# Patient Record
Sex: Male | Born: 1975 | Race: Black or African American | Hispanic: No | Marital: Married | Smoking: Current every day smoker
Health system: Southern US, Community
[De-identification: ages and names within clinical notes are randomized; demographics above are authoritative.]

## PROBLEM LIST (undated history)

## (undated) DIAGNOSIS — K219 Gastro-esophageal reflux disease without esophagitis: Secondary | ICD-10-CM

## (undated) DIAGNOSIS — E119 Type 2 diabetes mellitus without complications: Secondary | ICD-10-CM

## (undated) DIAGNOSIS — J45909 Unspecified asthma, uncomplicated: Secondary | ICD-10-CM

---

## 2018-02-05 ENCOUNTER — Emergency Department (HOSPITAL_COMMUNITY): Payer: Self-pay

## 2018-02-05 ENCOUNTER — Emergency Department (HOSPITAL_COMMUNITY)
Admission: EM | Admit: 2018-02-05 | Discharge: 2018-02-05 | Disposition: A | Discharge status: Home / Self Care | Payer: Self-pay | Attending: Emergency Medicine | Admitting: Emergency Medicine

## 2018-02-05 ENCOUNTER — Encounter (HOSPITAL_COMMUNITY): Payer: Self-pay | Admitting: Emergency Medicine

## 2018-02-05 DIAGNOSIS — Y92410 Unspecified street and highway as the place of occurrence of the external cause: Secondary | ICD-10-CM | POA: Insufficient documentation

## 2018-02-05 DIAGNOSIS — Y998 Other external cause status: Secondary | ICD-10-CM | POA: Insufficient documentation

## 2018-02-05 DIAGNOSIS — M62830 Muscle spasm of back: Secondary | ICD-10-CM | POA: Insufficient documentation

## 2018-02-05 DIAGNOSIS — Y939 Activity, unspecified: Secondary | ICD-10-CM | POA: Insufficient documentation

## 2018-02-05 DIAGNOSIS — S20219A Contusion of unspecified front wall of thorax, initial encounter: Secondary | ICD-10-CM | POA: Insufficient documentation

## 2018-02-05 DIAGNOSIS — M25512 Pain in left shoulder: Secondary | ICD-10-CM | POA: Insufficient documentation

## 2018-02-05 DIAGNOSIS — R0789 Other chest pain: Secondary | ICD-10-CM | POA: Insufficient documentation

## 2018-02-05 LAB — I-STAT CHEM 8, ED
BUN: 10 mg/dL (ref 6–20)
Calcium, Ion: 1.14 mmol/L — ABNORMAL LOW (ref 1.15–1.40)
Chloride: 101 mmol/L (ref 101–111)
Creatinine, Ser: 0.8 mg/dL (ref 0.61–1.24)
Glucose, Bld: 100 mg/dL — ABNORMAL HIGH (ref 65–99)
HEMATOCRIT: 44 % (ref 39.0–52.0)
HEMOGLOBIN: 15 g/dL (ref 13.0–17.0)
Potassium: 3.8 mmol/L (ref 3.5–5.1)
SODIUM: 139 mmol/L (ref 135–145)
TCO2: 25 mmol/L (ref 22–32)

## 2018-02-05 MED ORDER — HYDROCODONE-ACETAMINOPHEN 5-325 MG PO TABS
1.0000 | ORAL_TABLET | Freq: Once | ORAL | Status: AC
  Administered 2018-02-05: 1 via ORAL
  Filled 2018-02-05: qty 1

## 2018-02-05 MED ORDER — CYCLOBENZAPRINE HCL 10 MG PO TABS
10.0000 mg | ORAL_TABLET | Freq: Once | ORAL | Status: AC
  Administered 2018-02-05: 10 mg via ORAL
  Filled 2018-02-05: qty 1

## 2018-02-05 MED ORDER — DICLOFENAC SODIUM 50 MG PO TBEC: 50.0000 mg | DELAYED_RELEASE_TABLET | Freq: Two times a day (BID) | ORAL | 0 refills | Status: DC

## 2018-02-05 MED ORDER — CYCLOBENZAPRINE HCL 10 MG PO TABS: 10.0000 mg | ORAL_TABLET | Freq: Two times a day (BID) | ORAL | 0 refills | Status: DC | PRN

## 2018-02-05 MED ORDER — NAPROXEN 500 MG PO TABS: 500.0000 mg | ORAL_TABLET | Freq: Two times a day (BID) | ORAL | 0 refills | Status: DC

## 2018-02-05 NOTE — Progress Notes (Signed)
ED Nursing Note: Pt undressed to undergarment for exam by ED provider. Also instructed pt not to eat or drink until further notice. Pt understood request and agreed

## 2018-02-05 NOTE — ED Notes (Signed)
Bed: [facility] Expected date:  Expected time:  Means of arrival:  Comments: 

## 2018-02-05 NOTE — ED Notes (Signed)
PT DISCHARGED. INSTRUCTIONS AND PRESCRIPTIONS GIVEN. AAOX4. PT IN NO APPARENT DISTRESS WITH MODERATE PAIN. THE OPPORTUNITY TO ASK QUESTIONS WAS PROVIDED. 
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
## 2018-02-05 NOTE — Discharge Instructions (Addendum)
You may take tylenol in addition to the medications we give you. Follow up with your doctor or return here for worsening symptoms.

## 2018-11-15 ENCOUNTER — Ambulatory Visit (HOSPITAL_COMMUNITY)
Admission: EM | Admit: 2018-11-15 | Discharge: 2018-11-15 | Disposition: A | Discharge status: Home / Self Care | Payer: BLUE CROSS/BLUE SHIELD | Attending: Family Medicine | Admitting: Family Medicine

## 2018-11-15 ENCOUNTER — Encounter (HOSPITAL_COMMUNITY): Payer: Self-pay

## 2018-11-15 DIAGNOSIS — B372 Candidiasis of skin and nail: Secondary | ICD-10-CM | POA: Diagnosis not present

## 2018-11-15 DIAGNOSIS — M25561 Pain in right knee: Secondary | ICD-10-CM

## 2018-11-15 MED ORDER — CLOTRIMAZOLE 1 % EX CREA: TOPICAL_CREAM | CUTANEOUS | 0 refills | Status: DC

## 2018-11-15 MED ORDER — FLUCONAZOLE 150 MG PO TABS: 150.0000 mg | ORAL_TABLET | ORAL | 0 refills | Status: AC

## 2018-11-15 MED ORDER — NAPROXEN 500 MG PO TABS: 500.0000 mg | ORAL_TABLET | Freq: Two times a day (BID) | ORAL | 0 refills | Status: DC

## 2018-11-15 NOTE — Discharge Instructions (Signed)
Knee Pain: Use anti-inflammatories for pain/swelling. You may take up to 800 mg Ibuprofen every 8 hours OR Naprosyn twice daily with food. You may supplement Ibuprofen with Tylenol [phone] mg every 8 hours.  Ice and elevate knee at home to help with swelling and inflammation Wear knee brace for support Follow up with orthopedics if symptoms persisting in 2-3 weeks  Rash: This appears like yeast/jock itch Please use clotrimazole cream twice daily for 2 weeks May also try diflucan weekly for 4 weeks  Follow up if symptoms not improving or worsening

## 2018-11-15 NOTE — ED Triage Notes (Signed)
Pt presents with right knee pain from unknown source and rash in right groin area .

## 2018-11-15 NOTE — ED Provider Notes (Signed)
[facility]    CSN: [id number] Arrival date & time: 11/15/18  1530     History   Chief Complaint Chief Complaint  Patient presents with  . Knee Pain  . Rash    HPI James Adams is a 42 y.o. male no significant past medical history presenting today for evaluation of right knee pain and rash.  Patient states that he has had a rash to his groin off-and-on over the past few years.  Notices it worsens with sweating.  He is previously been told that it was yeast and prescribed powder.  States that the powder did not help.  Rashes associated with itching.  He also has tried applying cortisone cream and alcohol without relief of symptoms.  Denies any associated penile discharge, dysuria or increased frequency.  Denies abdominal pain.  Patient also complaining of right knee pain.  This is been going on for approximately 1 week.  He has noticed off-and-on swelling.  Worse at the end of the day after standing on his feet for most of the day.  He has not tried any over-the-counter medicines.  Is tried some elevation is noticed some worsening of stiffness after not moving the knee for extended period of time.  He denies any injury, trauma.  Denies history of arthritis, but does note that his dad has arthritis in his knees.  Often will feel a popping and clicking sensation or if something in his knees catching on something.  HPI  History reviewed. No pertinent past medical history.  There are no active problems to display for this patient.   History reviewed. No pertinent surgical history.     Home Medications    Prior to Admission medications   Medication Sig Start Date End Date Taking? Authorizing Provider  clotrimazole (LOTRIMIN) 1 % cream Apply to affected area 2 times daily 11/15/18   Wieters, Hallie C, PA-C  cyclobenzaprine (FLEXERIL) 10 MG tablet Take 1 tablet (10 mg total) by mouth 2 (two) times daily as needed for muscle spasms. 02/05/18   Janne Napoleon, NP  diclofenac  (VOLTAREN) 50 MG EC tablet Take 1 tablet (50 mg total) by mouth 2 (two) times daily. 02/05/18   Janne Napoleon, NP  fluconazole (DIFLUCAN) 150 MG tablet Take 1 tablet (150 mg total) by mouth once a week for 4 doses. 11/15/18 12/07/18  Wieters, Hallie C, PA-C  naproxen (NAPROSYN) 500 MG tablet Take 1 tablet (500 mg total) by mouth 2 (two) times daily. 11/15/18   Wieters, Junius Creamer, PA-C    Family History History reviewed. No pertinent family history.  Social History Social History   Tobacco Use  . Smoking status: Never Smoker  . Smokeless tobacco: Never Used  Substance Use Topics  . Alcohol use: Not on file  . Drug use: Not on file     Allergies   Patient has no known allergies.   Review of Systems Review of Systems  Constitutional: Negative for fatigue and fever.  Eyes: Negative for redness, itching and visual disturbance.  Respiratory: Negative for shortness of breath.   Cardiovascular: Negative for chest pain and leg swelling.  Gastrointestinal: Negative for nausea and vomiting.  Genitourinary: Negative for dysuria and genital sores.  Musculoskeletal: Positive for arthralgias, joint swelling and myalgias.  Skin: Positive for color change and rash. Negative for wound.  Neurological: Negative for dizziness, syncope, weakness, light-headedness and headaches.     Physical Exam Triage Vital Signs ED Triage Vitals  Enc Vitals Group  BP 11/15/18 1630 115/87     Pulse Rate 11/15/18 1630 95     Resp 11/15/18 1630 20     Temp 11/15/18 1630 98.2 F (36.8 C)     Temp Source 11/15/18 1630 Oral     SpO2 11/15/18 1630 95 %     Weight --      Height --      Head Circumference --      Peak Flow --      Pain Score 11/15/18 1631 6     Pain Loc --      Pain Edu? --      Excl. in GC? --    No data found.  Updated Vital Signs BP 115/87 (BP Location: Right Arm)   Pulse 95   Temp 98.2 F (36.8 C) (Oral)   Resp 20   SpO2 95%   Visual Acuity Right Eye Distance:   Left  Eye Distance:   Bilateral Distance:    Right Eye Near:   Left Eye Near:    Bilateral Near:     Physical Exam  Constitutional: He is oriented to person, place, and time. He appears well-developed and well-nourished.  No acute distress  HENT:  Head: Normocephalic and atraumatic.  Nose: Nose normal.  Eyes: Conjunctivae are normal.  Neck: Neck supple.  Cardiovascular: Normal rate.  Pulmonary/Chest: Effort normal. No respiratory distress.  Abdominal: He exhibits no distension.  Genitourinary:  Genitourinary Comments: Bilateral groin areas with hyperpigmentation, evidence of excoriation, no palpable nodes, induration or fluctuance  Musculoskeletal: Normal range of motion.  Right knee with mild swelling, no obvious deformity, full active range of motion of knee, audible pop underlying kneecap with extension, no crepitus palpated, no varus or valgus stress, negative Lachman's, negative McMurray's.  Neurological: He is alert and oriented to person, place, and time.  Skin: Skin is warm and dry.  Psychiatric: He has a normal mood and affect.  Nursing note and vitals reviewed.    UC Treatments / Results  Labs (all labs ordered are listed, but only abnormal results are displayed) Labs Reviewed - No data to display  EKG None  Radiology No results found.  Procedures Procedures (including critical care time)  Medications Ordered in UC Medications - No data to display  Initial Impression / Assessment and Plan / UC Course  I have reviewed the triage vital signs and the nursing notes.  Pertinent labs & imaging results that were available during my care of the patient were reviewed by me and considered in my medical decision making (see chart for details).     Patient appears to have yeast/dark itch to groin, as alternative to likely nystatin powder will try clotrimazole twice daily, given persistence of symptoms will also try Diflucan weekly for 2 to 4 weeks.  Discussed keeping area  dry.  Continue to monitor symptoms.  Right knee pain without injury, flares up and down with weightbearing.  Possible underlying arthritis.  Deferred imaging given likely will not change treatment.  Will provide knee brace for support, recommending anti-inflammatories, ice and elevation.  Follow-up if symptoms not resolving.Discussed strict return precautions. Patient verbalized understanding and is agreeable with plan.  Final Clinical Impressions(s) / UC Diagnoses   Final diagnoses:  Acute pain of right knee  Yeast dermatitis     Discharge Instructions     Knee Pain: Use anti-inflammatories for pain/swelling. You may take up to 800 mg Ibuprofen every 8 hours OR Naprosyn twice daily with food. You may supplement  Ibuprofen with Tylenol [phone] mg every 8 hours.  Ice and elevate knee at home to help with swelling and inflammation Wear knee brace for support Follow up with orthopedics if symptoms persisting in 2-3 weeks  Rash: This appears like yeast/jock itch Please use clotrimazole cream twice daily for 2 weeks May also try diflucan weekly for 4 weeks  Follow up if symptoms not improving or worsening   ED Prescriptions    Medication Sig Dispense Auth. Provider   clotrimazole (LOTRIMIN) 1 % cream Apply to affected area 2 times daily 24 g Wieters, Hallie C, PA-C   fluconazole (DIFLUCAN) 150 MG tablet Take 1 tablet (150 mg total) by mouth once a week for 4 doses. 4 tablet Wieters, Hallie C, PA-C   naproxen (NAPROSYN) 500 MG tablet Take 1 tablet (500 mg total) by mouth 2 (two) times daily. 30 tablet Wieters, SectionHallie C, PA-C     Controlled Substance Prescriptions [place] Controlled Substance Registry consulted? Not Applicable   Lew DawesWieters, Hallie C, [place]PA-C 11/15/18 2123

## 2019-03-08 ENCOUNTER — Other Ambulatory Visit: Payer: Self-pay

## 2019-03-08 ENCOUNTER — Encounter (HOSPITAL_COMMUNITY): Payer: Self-pay

## 2019-03-08 ENCOUNTER — Ambulatory Visit (HOSPITAL_COMMUNITY)
Admission: EM | Admit: 2019-03-08 | Discharge: 2019-03-08 | Disposition: A | Discharge status: Home / Self Care | Payer: BLUE CROSS/BLUE SHIELD | Attending: Family Medicine | Admitting: Family Medicine

## 2019-03-08 DIAGNOSIS — B9789 Other viral agents as the cause of diseases classified elsewhere: Secondary | ICD-10-CM

## 2019-03-08 DIAGNOSIS — J069 Acute upper respiratory infection, unspecified: Secondary | ICD-10-CM

## 2019-03-08 MED ORDER — AMOXICILLIN 500 MG PO CAPS: 500.0000 mg | ORAL_CAPSULE | Freq: Three times a day (TID) | ORAL | 0 refills | Status: DC

## 2019-03-08 NOTE — ED Provider Notes (Signed)
[facility]    CSN: [id number] Arrival date & time: 03/08/19  1040     History   Chief Complaint Chief Complaint  Patient presents with  . Cough  . Facial Pain    HPI James Adams is a 43 y.o. male.   Patient requesting note to return to work.  Had some cough over the weekend cough is somewhat productive and he feels this is related to some postnasal drainage.  He endorses some pain and pressure in his sinuses.  HPI  History reviewed. No pertinent past medical history.  There are no active problems to display for this patient.   History reviewed. No pertinent surgical history.     Home Medications    Prior to Admission medications   Medication Sig Start Date End Date Taking? Authorizing Provider  amoxicillin (AMOXIL) 500 MG capsule Take 1 capsule (500 mg total) by mouth 3 (three) times daily. 03/08/19   Frederica Kuster, MD  clotrimazole (LOTRIMIN) 1 % cream Apply to affected area 2 times daily 11/15/18   Wieters, Hallie C, PA-C  cyclobenzaprine (FLEXERIL) 10 MG tablet Take 1 tablet (10 mg total) by mouth 2 (two) times daily as needed for muscle spasms. 02/05/18   Janne Napoleon, NP  diclofenac (VOLTAREN) 50 MG EC tablet Take 1 tablet (50 mg total) by mouth 2 (two) times daily. 02/05/18   Janne Napoleon, NP  naproxen (NAPROSYN) 500 MG tablet Take 1 tablet (500 mg total) by mouth 2 (two) times daily. 11/15/18   Wieters, Junius Creamer, PA-C    Family History History reviewed. No pertinent family history.  Social History Social History   Tobacco Use  . Smoking status: Never Smoker  . Smokeless tobacco: Never Used  Substance Use Topics  . Alcohol use: Not on file  . Drug use: Not on file     Allergies   Patient has no known allergies.   Review of Systems Review of Systems  HENT: Positive for sinus pressure and sinus pain.   Respiratory: Positive for cough.   All other systems reviewed and are negative.    Physical Exam Triage Vital Signs ED Triage  Vitals  Enc Vitals Group     BP 03/08/19 1124 (!) 146/94     Pulse Rate 03/08/19 1124 89     Resp 03/08/19 1124 18     Temp 03/08/19 1124 98.3 F (36.8 C)     Temp Source 03/08/19 1124 Oral     SpO2 03/08/19 1124 98 %     Weight 03/08/19 1123 298 lb (135.2 kg)     Height --      Head Circumference --      Peak Flow --      Pain Score 03/08/19 1122 1     Pain Loc --      Pain Edu? --      Excl. in GC? --    No data found.  Updated Vital Signs BP (!) 146/94 (BP Location: Left Arm)   Pulse 89   Temp 98.3 F (36.8 C) (Oral)   Resp 18   Wt 135.2 kg   SpO2 98%   BMI 40.42 kg/m   Visual Acuity Right Eye Distance:   Left Eye Distance:   Bilateral Distance:    Right Eye Near:   Left Eye Near:    Bilateral Near:     Physical Exam Constitutional:      Appearance: Normal appearance. He is normal weight.  HENT:  Head:     Comments: There is tenderness in the maxillary sinuses    Right Ear: Tympanic membrane normal.     Left Ear: Tympanic membrane normal.     Mouth/Throat:     Pharynx: Oropharynx is clear.  Neck:     Musculoskeletal: Normal range of motion and neck supple.  Cardiovascular:     Rate and Rhythm: Normal rate and regular rhythm.  Pulmonary:     Effort: Pulmonary effort is normal.     Breath sounds: Normal breath sounds.  Neurological:     Mental Status: He is alert.      UC Treatments / Results  Labs (all labs ordered are listed, but only abnormal results are displayed) Labs Reviewed - No data to display  EKG None  Radiology No results found.  Procedures Procedures (including critical care time)  Medications Ordered in UC Medications - No data to display  Initial Impression / Assessment and Plan / UC Course  I have reviewed the triage vital signs and the nursing notes.  Pertinent labs & imaging results that were available during my care of the patient were reviewed by me and considered in my medical decision making (see chart for  details).     Sinusitis.  Will treat with amoxicillin continue guaifenesin Final Clinical Impressions(s) / UC Diagnoses   Final diagnoses:  Viral URI with cough   Discharge Instructions   None    ED Prescriptions    Medication Sig Dispense Auth. Provider   amoxicillin (AMOXIL) 500 MG capsule Take 1 capsule (500 mg total) by mouth 3 (three) times daily. 21 capsule Frederica Kuster, MD     Controlled Substance Prescriptions [place] Controlled Substance Registry consulted? No   Frederica Kuster, MD 03/08/19 [phone]

## 2019-03-08 NOTE — ED Triage Notes (Addendum)
Pt cc needs work note to return back to work . Because he was coughing over the weekend. Pt is having some sinus pressure x 4 days.

## 2019-03-10 ENCOUNTER — Telehealth (HOSPITAL_COMMUNITY): Payer: Self-pay | Admitting: Emergency Medicine

## 2019-03-10 MED ORDER — AMOXICILLIN 500 MG PO CAPS: 500.0000 mg | ORAL_CAPSULE | Freq: Three times a day (TID) | ORAL | 0 refills | Status: DC

## 2019-03-10 NOTE — Telephone Encounter (Signed)
Patient called, states he went to pick up his medications and they were not available.  After discussing discharge instructions, this RN noted patient's pharmacy to be incorrect.  Amoxicillin prescription sent to appropriate pharmacy for patient to pick up.,

## 2019-04-12 ENCOUNTER — Encounter (HOSPITAL_COMMUNITY): Payer: Self-pay

## 2019-04-12 ENCOUNTER — Other Ambulatory Visit: Payer: Self-pay

## 2019-04-12 ENCOUNTER — Ambulatory Visit (HOSPITAL_COMMUNITY)
Admission: EM | Admit: 2019-04-12 | Discharge: 2019-04-12 | Disposition: A | Discharge status: Home / Self Care | Payer: Managed Care, Other (non HMO) | Attending: Family Medicine | Admitting: Family Medicine

## 2019-04-12 DIAGNOSIS — J209 Acute bronchitis, unspecified: Secondary | ICD-10-CM | POA: Diagnosis not present

## 2019-04-12 MED ORDER — FLUTICASONE PROPIONATE 50 MCG/ACT NA SUSP: 1.0000 | Freq: Every day | NASAL | 0 refills | Status: DC

## 2019-04-12 MED ORDER — ALBUTEROL SULFATE HFA 108 (90 BASE) MCG/ACT IN AERS
1.0000 | INHALATION_SPRAY | Freq: Four times a day (QID) | RESPIRATORY_TRACT | 0 refills | Status: DC | PRN
Start: 2019-04-12 — End: 2020-01-10

## 2019-04-12 MED ORDER — BENZONATATE 200 MG PO CAPS
200.0000 mg | ORAL_CAPSULE | Freq: Three times a day (TID) | ORAL | 0 refills | Status: AC | PRN
Start: 2019-04-12 — End: 2019-04-19

## 2019-04-12 MED ORDER — DOXYCYCLINE HYCLATE 100 MG PO CAPS: 100.0000 mg | ORAL_CAPSULE | Freq: Two times a day (BID) | ORAL | 0 refills | Status: AC

## 2019-04-12 MED ORDER — CETIRIZINE HCL 10 MG PO CAPS: 10.0000 mg | ORAL_CAPSULE | Freq: Every day | ORAL | 0 refills | Status: DC

## 2019-04-12 NOTE — ED Notes (Signed)
Patient verbalizes understanding of discharge instructions. Opportunity for questioning and answers were provided. Patient discharged from [facility] by RN.  

## 2019-04-12 NOTE — ED Provider Notes (Signed)
[facility]    CSN: [id number] Arrival date & time: 04/12/19  4098     History   Chief Complaint Chief Complaint  Patient presents with  . Cough    HPI James Adams is a 43 y.o. male no contributing past medical history presenting today for evaluation of a cough.  Patient states that he has had a cough for little over a week.  He is also had associated nasal congestion.  Denies sore throat.  Denies fevers, chills or body aches.  He is also noted increased shortness of breath and chest tightness recently.  He notes this all started after his wife brought home with cat which is recently been removed from the home.  He notes his symptoms were mainly worse at nighttime and was noting wheezing at nighttime.  Has a history of bronchitis, former smoker.  Has been using albuterol inhaler which he is now out of.  HPI  History reviewed. No pertinent past medical history.  There are no active problems to display for this patient.   History reviewed. No pertinent surgical history.     Home Medications    Prior to Admission medications   Medication Sig Start Date End Date Taking? Authorizing Provider  albuterol (VENTOLIN HFA) 108 (90 Base) MCG/ACT inhaler Inhale 1-2 puffs into the lungs every 6 (six) hours as needed for wheezing or shortness of breath. 04/12/19   ,  C, PA-C  benzonatate (TESSALON) 200 MG capsule Take 1 capsule (200 mg total) by mouth 3 (three) times daily as needed for up to 7 days for cough. 04/12/19 04/19/19  ,  C, PA-C  Cetirizine HCl 10 MG CAPS Take 1 capsule (10 mg total) by mouth daily. 04/12/19   ,  C, PA-C  clotrimazole (LOTRIMIN) 1 % cream Apply to affected area 2 times daily 11/15/18   ,  C, PA-C  cyclobenzaprine (FLEXERIL) 10 MG tablet Take 1 tablet (10 mg total) by mouth 2 (two) times daily as needed for muscle spasms. 02/05/18   Janne Napoleon, NP  diclofenac (VOLTAREN) 50 MG EC tablet Take 1 tablet (50 mg  total) by mouth 2 (two) times daily. 02/05/18   Janne Napoleon, NP  doxycycline (VIBRAMYCIN) 100 MG capsule Take 1 capsule (100 mg total) by mouth 2 (two) times daily for 10 days. 04/12/19 04/22/19  ,  C, PA-C  fluticasone (FLONASE) 50 MCG/ACT nasal spray Place 1-2 sprays into both nostrils daily for 7 days. 04/12/19 04/19/19  ,  C, PA-C  naproxen (NAPROSYN) 500 MG tablet Take 1 tablet (500 mg total) by mouth 2 (two) times daily. 11/15/18   , Junius Creamer, PA-C    Family History History reviewed. No pertinent family history.  Social History Social History   Tobacco Use  . Smoking status: Never Smoker  . Smokeless tobacco: Never Used  Substance Use Topics  . Alcohol use: Not on file  . Drug use: Not on file     Allergies   Patient has no known allergies.   Review of Systems Review of Systems  Constitutional: Negative for activity change, appetite change, chills, fatigue and fever.  HENT: Positive for congestion and rhinorrhea. Negative for ear pain, sinus pressure, sore throat and trouble swallowing.   Eyes: Negative for discharge and redness.  Respiratory: Positive for cough, chest tightness, shortness of breath and wheezing.   Cardiovascular: Negative for chest pain.  Gastrointestinal: Negative for abdominal pain, diarrhea, nausea and vomiting.  Musculoskeletal: Negative for myalgias.  Skin:  Negative for rash.  Neurological: Negative for dizziness, light-headedness and headaches.     Physical Exam Triage Vital Signs ED Triage Vitals  Enc Vitals Group     BP 04/12/19 0919 (!) 139/105     Pulse Rate 04/12/19 0919 91     Resp 04/12/19 0919 17     Temp 04/12/19 0919 98.2 F (36.8 C)     Temp Source 04/12/19 0919 Oral     SpO2 04/12/19 0919 99 %     Weight --      Height --      Head Circumference --      Peak Flow --      Pain Score 04/12/19 0917 0     Pain Loc --      Pain Edu? --      Excl. in GC? --    No data found.  Updated Vital  Signs BP (!) 139/105 (BP Location: Left Arm)   Pulse 91   Temp 98.2 F (36.8 C) (Oral)   Resp 17   SpO2 99%   Visual Acuity Right Eye Distance:   Left Eye Distance:   Bilateral Distance:    Right Eye Near:   Left Eye Near:    Bilateral Near:     Physical Exam Vitals signs and nursing note reviewed.  Constitutional:      Appearance: He is well-developed.  HENT:     Head: Normocephalic and atraumatic.     Ears:     Comments: Bilateral TMs difficult to visualize due to cerumen-denied ear pain    Nose:     Comments: Nasal mucosa non-erythematous, mildly swollen turbinates    Mouth/Throat:     Comments: Oral mucosa pink and moist, no tonsillar enlargement or exudate. Posterior pharynx patent and erythematous, no uvula deviation or swelling. Normal phonation. Eyes:     Conjunctiva/sclera: Conjunctivae normal.  Neck:     Musculoskeletal: Neck supple.  Cardiovascular:     Rate and Rhythm: Normal rate and regular rhythm.     Heart sounds: No murmur.  Pulmonary:     Effort: Pulmonary effort is normal. No respiratory distress.     Breath sounds: Normal breath sounds.     Comments: Breathing comfortably at rest, CTABL, no wheezing, rales or other adventitious sounds auscultated Abdominal:     Palpations: Abdomen is soft.     Tenderness: There is no abdominal tenderness.  Skin:    General: Skin is warm and dry.  Neurological:     Mental Status: He is alert.      UC Treatments / Results  Labs (all labs ordered are listed, but only abnormal results are displayed) Labs Reviewed - No data to display  EKG None  Radiology No results found.  Procedures Procedures (including critical care time)  Medications Ordered in UC Medications - No data to display  Initial Impression / Assessment and Plan / UC Course  I have reviewed the triage vital signs and the nursing notes.  Pertinent labs & imaging results that were available during my care of the patient were reviewed by  me and considered in my medical decision making (see chart for details).     Vital signs stable, no fever, tachycardia or hypoxia.  Given reported wheezing with shortness of breath will treat for bronchitis, lungs clear during visit.  Do not suspect underlying pneumonia at this time.  Discussed with patient cannot rule out coronavirus, the feel this is less likely given lacking associated systemic symptoms.  Deferring chest x-ray for now based off vitals and auscultation.  Will cover for atypicals with doxycycline, Tessalon for cough.  Refilling albuterol inhaler.  Deferring steroids at this time.  Also recommended starting allergy medicine of Zyrtec and Flonase with any allergies contributing to symptoms given recent cat exposure.  Continue to monitor,Discussed strict return precautions. Patient verbalized understanding and is agreeable with plan.  Final Clinical Impressions(s) / UC Diagnoses   Final diagnoses:  Acute bronchitis, unspecified organism     Discharge Instructions     Please begin taking doxycycline for the next 10 days, to cover for infection in the lungs Tessalon every 8 hours as needed for cough Continue albuterol inhaler as needed for shortness of breath, wheezing Begin daily cetirizine/Zyrtec to help with any congestion/drainage/allergies Flonase nasal spray 1 to 2 sprays in each nostril daily Rest, drink plenty of fluids  Please stay at home as much as possible and limit exposure to others until symptoms fully resolved  Please follow-up if symptoms worsening, developing increased shortness of breath or difficulty breathing   ED Prescriptions    Medication Sig Dispense Auth. Provider   doxycycline (VIBRAMYCIN) 100 MG capsule Take 1 capsule (100 mg total) by mouth 2 (two) times daily for 10 days. 20 capsule ,  C, PA-C   benzonatate (TESSALON) 200 MG capsule Take 1 capsule (200 mg total) by mouth 3 (three) times daily as needed for up to 7 days for cough.  28 capsule ,  C, PA-C   albuterol (VENTOLIN HFA) 108 (90 Base) MCG/ACT inhaler Inhale 1-2 puffs into the lungs every 6 (six) hours as needed for wheezing or shortness of breath. 1 Inhaler ,  C, PA-C   Cetirizine HCl 10 MG CAPS Take 1 capsule (10 mg total) by mouth daily. 15 capsule ,  C, PA-C   fluticasone (FLONASE) 50 MCG/ACT nasal spray Place 1-2 sprays into both nostrils daily for 7 days. 1 g , Bruno C, PA-C     Controlled Substance Prescriptions [place] Controlled Substance Registry consulted? Not Applicable   Lew Dawes, [place] 04/12/19 [phone]

## 2019-04-12 NOTE — ED Triage Notes (Signed)
Patient presents to Urgent Care with complaints of cough that wont go away and need for inhaler refill since about a week ago. Patient states he has a cat allergy and his wife recently came home with a cat, which she has since gotten rid of. Pt has productive cough and runny nose.

## 2019-04-12 NOTE — Discharge Instructions (Signed)
Please begin taking doxycycline for the next 10 days, to cover for infection in the lungs Tessalon every 8 hours as needed for cough Continue albuterol inhaler as needed for shortness of breath, wheezing Begin daily cetirizine/Zyrtec to help with any congestion/drainage/allergies Flonase nasal spray 1 to 2 sprays in each nostril daily Rest, drink plenty of fluids  Please stay at home as much as possible and limit exposure to others until symptoms fully resolved  Please follow-up if symptoms worsening, developing increased shortness of breath or difficulty breathing

## 2019-04-27 ENCOUNTER — Emergency Department (HOSPITAL_COMMUNITY)
Admission: EM | Admit: 2019-04-27 | Discharge: 2019-04-27 | Disposition: A | Discharge status: Home / Self Care | Payer: Managed Care, Other (non HMO) | Attending: Emergency Medicine | Admitting: Emergency Medicine

## 2019-04-27 ENCOUNTER — Emergency Department (HOSPITAL_COMMUNITY): Payer: Managed Care, Other (non HMO)

## 2019-04-27 ENCOUNTER — Encounter (HOSPITAL_COMMUNITY): Payer: Self-pay | Admitting: Emergency Medicine

## 2019-04-27 ENCOUNTER — Other Ambulatory Visit: Payer: Self-pay

## 2019-04-27 DIAGNOSIS — R05 Cough: Secondary | ICD-10-CM | POA: Insufficient documentation

## 2019-04-27 DIAGNOSIS — R109 Unspecified abdominal pain: Secondary | ICD-10-CM

## 2019-04-27 DIAGNOSIS — Y929 Unspecified place or not applicable: Secondary | ICD-10-CM | POA: Diagnosis not present

## 2019-04-27 DIAGNOSIS — R1011 Right upper quadrant pain: Secondary | ICD-10-CM | POA: Diagnosis present

## 2019-04-27 DIAGNOSIS — T148XXA Other injury of unspecified body region, initial encounter: Secondary | ICD-10-CM

## 2019-04-27 DIAGNOSIS — Y999 Unspecified external cause status: Secondary | ICD-10-CM | POA: Diagnosis not present

## 2019-04-27 DIAGNOSIS — X58XXXA Exposure to other specified factors, initial encounter: Secondary | ICD-10-CM | POA: Insufficient documentation

## 2019-04-27 DIAGNOSIS — S29019A Strain of muscle and tendon of unspecified wall of thorax, initial encounter: Secondary | ICD-10-CM | POA: Insufficient documentation

## 2019-04-27 DIAGNOSIS — R079 Chest pain, unspecified: Secondary | ICD-10-CM | POA: Diagnosis not present

## 2019-04-27 DIAGNOSIS — Y939 Activity, unspecified: Secondary | ICD-10-CM | POA: Insufficient documentation

## 2019-04-27 MED ORDER — CYCLOBENZAPRINE HCL 10 MG PO TABS: 10.0000 mg | ORAL_TABLET | Freq: Three times a day (TID) | ORAL | 0 refills | Status: DC

## 2019-04-27 MED ORDER — TRAMADOL HCL 50 MG PO TABS: 50.0000 mg | ORAL_TABLET | Freq: Four times a day (QID) | ORAL | 0 refills | Status: DC | PRN

## 2019-04-27 MED ORDER — DICLOFENAC SODIUM 75 MG PO TBEC: 75.0000 mg | DELAYED_RELEASE_TABLET | Freq: Two times a day (BID) | ORAL | 0 refills | Status: DC

## 2019-04-27 NOTE — Discharge Instructions (Addendum)
Your oxygen level is within normal limits.  Your chest x-ray is negative for collapsed lung, fluid, mass, or pneumonia.  There is no evidence of a rib fracture or dislocation noted on your x-ray.  Your examination favors acute muscle strain.  Heating pad to this area during rest will be helpful.  Please use diclofenac 2 times daily with food.  Use Flexeril 3 times daily for spasm pain.  May use Ultram for more severe pain.  Ultram and Flexeril may cause drowsiness.  Please do not drive a vehicle, operate machinery, drink alcohol, or participate in activities requiring concentration when taking this medication.

## 2019-04-27 NOTE — ED Provider Notes (Signed)
[facility] Provider Note   CSN: [id number] Arrival date & time: 04/27/19  1746    History   Chief Complaint Chief Complaint  Patient presents with  . Flank Pain    HPI James Adams is a 43 y.o. male.     HPI  History reviewed. No pertinent past medical history.  There are no active problems to display for this patient.   History reviewed. No pertinent surgical history.      Home Medications    Prior to Admission medications   Medication Sig Start Date End Date Taking? Authorizing Provider  albuterol (VENTOLIN HFA) 108 (90 Base) MCG/ACT inhaler Inhale 1-2 puffs into the lungs every 6 (six) hours as needed for wheezing or shortness of breath. 04/12/19   Wieters, Hallie C, PA-C  Cetirizine HCl 10 MG CAPS Take 1 capsule (10 mg total) by mouth daily. 04/12/19   Wieters, Hallie C, PA-C  clotrimazole (LOTRIMIN) 1 % cream Apply to affected area 2 times daily 11/15/18   Wieters, Hallie C, PA-C  cyclobenzaprine (FLEXERIL) 10 MG tablet Take 1 tablet (10 mg total) by mouth 2 (two) times daily as needed for muscle spasms. 02/05/18   Janne NapoleonNeese, Hope M, NP  diclofenac (VOLTAREN) 50 MG EC tablet Take 1 tablet (50 mg total) by mouth 2 (two) times daily. 02/05/18   Janne NapoleonNeese, Hope M, NP  fluticasone (FLONASE) 50 MCG/ACT nasal spray Place 1-2 sprays into both nostrils daily for 7 days. 04/12/19 04/19/19  Wieters, Hallie C, PA-C  naproxen (NAPROSYN) 500 MG tablet Take 1 tablet (500 mg total) by mouth 2 (two) times daily. 11/15/18   Wieters, Junius CreamerHallie C, PA-C    Family History No family history on file.  Social History Social History   Tobacco Use  . Smoking status: Never Smoker  . Smokeless tobacco: Never Used  Substance Use Topics  . Alcohol use: Not on file  . Drug use: Not on file     Allergies   Patient has no known allergies.   Review of Systems Review of Systems  Constitutional: Negative for activity change, chills, diaphoresis and fatigue.       All ROS Neg  except as noted in HPI  HENT: Positive for sneezing.   Eyes: Negative for photophobia and discharge.  Respiratory: Negative for cough, shortness of breath and wheezing.   Cardiovascular: Negative for chest pain and palpitations.  Gastrointestinal: Negative for abdominal pain and blood in stool.  Genitourinary: Negative for dysuria, frequency and hematuria.  Musculoskeletal: Negative for arthralgias, back pain and neck pain.       Rib pain  Skin: Negative.   Neurological: Negative for dizziness, seizures and speech difficulty.  Psychiatric/Behavioral: Negative for confusion and hallucinations.     Physical Exam Updated Vital Signs BP 129/90 (BP Location: Right Arm)   Pulse 89   Temp 98.6 F (37 C) (Oral)   Resp 12   Ht 6' (1.829 m)   Wt 136.1 kg   SpO2 94%   BMI 40.69 kg/m   Physical Exam Vitals signs and nursing note reviewed.  Constitutional:      Appearance: He is well-developed. He is not toxic-appearing.  HENT:     Head: Normocephalic.     Right Ear: Tympanic membrane and external ear normal.     Left Ear: Tympanic membrane and external ear normal.  Eyes:     General: Lids are normal.     Pupils: Pupils are equal, round, and reactive to light.  Neck:  Musculoskeletal: Normal range of motion and neck supple.     Vascular: No carotid bruit.  Cardiovascular:     Rate and Rhythm: Normal rate and regular rhythm.     Pulses: Normal pulses.     Heart sounds: Normal heart sounds.  Pulmonary:     Effort: No respiratory distress.     Breath sounds: Normal breath sounds.     Comments: Patient speaks in complete sentences.  There is symmetrical rise and fall of the chest.  No use of accessory muscles. Right lower chest pain reproduced with ROM. No deformity. No crepitus. Chest:       Comments: Mild to mod xyphoid tenderness. No deformity or crepitus. Abdominal:     General: Bowel sounds are normal.     Palpations: Abdomen is soft.     Tenderness: There is no  abdominal tenderness. There is no guarding.  Musculoskeletal: Normal range of motion.  Lymphadenopathy:     Head:     Right side of head: No submandibular adenopathy.     Left side of head: No submandibular adenopathy.     Cervical: No cervical adenopathy.  Skin:    General: Skin is warm and dry.  Neurological:     Mental Status: He is alert and oriented to person, place, and time.     Cranial Nerves: No cranial nerve deficit.     Sensory: No sensory deficit.  Psychiatric:        Speech: Speech normal.      ED Treatments / Results  Labs (all labs ordered are listed, but only abnormal results are displayed) Labs Reviewed - No data to display  EKG None  Radiology No results found.  Procedures Procedures (including critical care time)  Medications Ordered in ED Medications - No data to display   Initial Impression / Assessment and Plan / ED Course  I have reviewed the triage vital signs and the nursing notes.  Pertinent labs & imaging results that were available during my care of the patient were reviewed by me and considered in my medical decision making (see chart for details).          Final Clinical Impressions(s) / ED Diagnoses MDM  Vital signs are WNL  Pt speaks in complete sentences. Symmetrical rise and fall of the chest. Chest xray neg for bone abnormality. Lungs clear. Exam favors muscle strain.  Rx for muscle relaxant given. Pt to return if any problem or changes in condition.   Final diagnoses:  Muscle strain  Right flank pain    ED Discharge Orders         Ordered    cyclobenzaprine (FLEXERIL) 10 MG tablet  3 times daily     04/27/19 1932    traMADol (ULTRAM) 50 MG tablet  Every 6 hours PRN     04/27/19 1932    diclofenac (VOLTAREN) 75 MG EC tablet  2 times daily     04/27/19 1932           Ivery Quale, PA-C 04/28/19 1712    Terrilee Files, MD 05/01/19 [phone]

## 2019-04-27 NOTE — ED Triage Notes (Signed)
Pt states that he sneezed and is having pain in right abd and flank since then

## 2019-04-27 NOTE — ED Notes (Signed)
Pt c/o sudden pain to right upper anterior/lateral ribs after sneezing. C/o knot at end of sternum that has been there for a while  And wants it checked out. No obvious swelling noted. Nad.

## 2020-01-10 ENCOUNTER — Emergency Department (HOSPITAL_COMMUNITY)
Admission: EM | Admit: 2020-01-10 | Discharge: 2020-01-10 | Disposition: A | Discharge status: Home / Self Care | Payer: Self-pay | Attending: Emergency Medicine | Admitting: Emergency Medicine

## 2020-01-10 ENCOUNTER — Emergency Department (HOSPITAL_COMMUNITY): Payer: Self-pay

## 2020-01-10 ENCOUNTER — Encounter (HOSPITAL_COMMUNITY): Payer: Self-pay | Admitting: Emergency Medicine

## 2020-01-10 ENCOUNTER — Other Ambulatory Visit: Payer: Self-pay

## 2020-01-10 DIAGNOSIS — E669 Obesity, unspecified: Secondary | ICD-10-CM | POA: Insufficient documentation

## 2020-01-10 DIAGNOSIS — Z79899 Other long term (current) drug therapy: Secondary | ICD-10-CM | POA: Insufficient documentation

## 2020-01-10 DIAGNOSIS — Z20822 Contact with and (suspected) exposure to covid-19: Secondary | ICD-10-CM | POA: Insufficient documentation

## 2020-01-10 DIAGNOSIS — R062 Wheezing: Secondary | ICD-10-CM | POA: Insufficient documentation

## 2020-01-10 DIAGNOSIS — Z87891 Personal history of nicotine dependence: Secondary | ICD-10-CM | POA: Insufficient documentation

## 2020-01-10 DIAGNOSIS — Z6841 Body Mass Index (BMI) 40.0 and over, adult: Secondary | ICD-10-CM | POA: Insufficient documentation

## 2020-01-10 MED ORDER — ALBUTEROL SULFATE HFA 108 (90 BASE) MCG/ACT IN AERS
1.0000 | INHALATION_SPRAY | Freq: Four times a day (QID) | RESPIRATORY_TRACT | 1 refills | Status: DC | PRN
Start: 2020-01-10 — End: 2022-04-01

## 2020-01-10 MED ORDER — FLUTICASONE PROPIONATE 50 MCG/ACT NA SUSP: 2.0000 | Freq: Every day | NASAL | 2 refills | Status: DC

## 2020-01-10 MED ORDER — PREDNISONE 10 MG (21) PO TBPK: ORAL_TABLET | ORAL | 0 refills | Status: DC

## 2020-01-10 MED ORDER — MONTELUKAST SODIUM 10 MG PO TABS: 10.0000 mg | ORAL_TABLET | Freq: Every day | ORAL | 2 refills | Status: DC

## 2020-01-10 MED ORDER — PREDNISONE 20 MG PO TABS
60.0000 mg | ORAL_TABLET | Freq: Once | ORAL | Status: AC
  Administered 2020-01-10: 12:00:00 60 mg via ORAL
  Filled 2020-01-10: qty 3

## 2020-01-10 MED ORDER — ALBUTEROL SULFATE HFA 108 (90 BASE) MCG/ACT IN AERS
2.0000 | INHALATION_SPRAY | Freq: Once | RESPIRATORY_TRACT | Status: AC
  Administered 2020-01-10: 12:00:00 2 via RESPIRATORY_TRACT
  Filled 2020-01-10: qty 6.7

## 2020-01-10 NOTE — ED Triage Notes (Signed)
Pt states he was here a few months and seen for SOB. Endorses CP for 3 days, SOB. Reports wheezing at night but none throughout the day.

## 2020-01-10 NOTE — ED Notes (Signed)
Patient transported to XR. 

## 2020-01-10 NOTE — ED Provider Notes (Signed)
[facility] EMERGENCY DEPARTMENT Provider Note   CSN: [id number] Arrival date & time: 01/10/20  1102     History Chief Complaint  Patient presents with  . Shortness of Breath    James Adams is a 44 y.o. male.  HPI      James Adams is a 44 y.o. male, patient with no pertinent officially diagnosed past medical history, presenting to the ED with shortness of breath and wheezing.  Patient states he is been experiencing the symptoms for several months, dating back to at least spring 2020.  He typically will intermittently experience wheezing at night, at times weather change, or with going from a warm environment to a cold environment.  He has had similar symptoms in childhood and intermittently throughout his adult life, but none so consistent as those over the last several months. He has been using a family members inhaler during times of wheezing, which improves symptoms. He used to be a 1 pack/day smoker, but quit 2 years ago.  Patient's wife is still a heavy smoker.  Denies illicit drug use, including marijuana.  He also notes several days of nasal congestion and rhinorrhea.  Denies fever/chills, chest pain, abdominal pain, current shortness of breath, N/V/D, syncope, or any other complaints.     History reviewed. No pertinent past medical history.  There are no problems to display for this patient.   History reviewed. No pertinent surgical history.     No family history on file.  Social History   Tobacco Use  . Smoking status: Never Smoker  . Smokeless tobacco: Never Used  Substance Use Topics  . Alcohol use: Not on file  . Drug use: Not on file    Home Medications Prior to Admission medications   Medication Sig Start Date End Date Taking? Authorizing Provider  albuterol (VENTOLIN HFA) 108 (90 Base) MCG/ACT inhaler Inhale 1-2 puffs into the lungs every 6 (six) hours as needed for wheezing or shortness of breath. 01/10/20   Sovereign Ramiro C, PA-C    Cetirizine HCl 10 MG CAPS Take 1 capsule (10 mg total) by mouth daily. 04/12/19   Wieters, Hallie C, PA-C  clotrimazole (LOTRIMIN) 1 % cream Apply to affected area 2 times daily 11/15/18   Wieters, Hallie C, PA-C  cyclobenzaprine (FLEXERIL) 10 MG tablet Take 1 tablet (10 mg total) by mouth 3 (three) times daily. 04/27/19   Lily Kocher, PA-C  diclofenac (VOLTAREN) 75 MG EC tablet Take 1 tablet (75 mg total) by mouth 2 (two) times daily. 04/27/19   Lily Kocher, PA-C  fluticasone (FLONASE) 50 MCG/ACT nasal spray Place 2 sprays into both nostrils daily. 01/10/20   Fredick Schlosser C, PA-C  montelukast (SINGULAIR) 10 MG tablet Take 1 tablet (10 mg total) by mouth at bedtime. 01/10/20 04/09/20  Shamon Lobo C, PA-C  naproxen (NAPROSYN) 500 MG tablet Take 1 tablet (500 mg total) by mouth 2 (two) times daily. 11/15/18   Wieters, Hallie C, PA-C  predniSONE (STERAPRED UNI-PAK 21 TAB) 10 MG (21) TBPK tablet Take 6 tabs (60mg ) day 1, 5 tabs (50mg ) day 2, 4 tabs (40mg ) day 3, 3 tabs (30mg ) day 4, 2 tabs (20mg ) day 5, and 1 tab (10mg ) day 6. 01/10/20   Breland Elders C, PA-C  traMADol (ULTRAM) 50 MG tablet Take 1 tablet (50 mg total) by mouth every 6 (six) hours as needed. 04/27/19   Lily Kocher, PA-C    Allergies    Patient has no known allergies.  Review of Systems  Review of Systems  Constitutional: Negative for chills, diaphoresis and fever.  HENT: Positive for congestion and rhinorrhea.   Respiratory: Positive for shortness of breath and wheezing.   Cardiovascular: Negative for chest pain and leg swelling.  Gastrointestinal: Negative for abdominal pain, diarrhea, nausea and vomiting.  Neurological: Negative for dizziness, syncope and weakness.  All other systems reviewed and are negative.   Physical Exam Updated Vital Signs BP (!) 167/90 (BP Location: Right Wrist)   Pulse 94   Temp 98.6 F (37 C) (Oral)   Resp 18   Ht 6' (1.829 m)   Wt 136.1 kg   SpO2 92%   BMI 40.69 kg/m   Physical Exam Vitals  and nursing note reviewed.  Constitutional:      General: He is not in acute distress.    Appearance: He is well-developed. He is obese. He is not diaphoretic.  HENT:     Head: Normocephalic and atraumatic.     Mouth/Throat:     Mouth: Mucous membranes are moist.     Pharynx: Oropharynx is clear.  Eyes:     Conjunctiva/sclera: Conjunctivae normal.  Cardiovascular:     Rate and Rhythm: Normal rate and regular rhythm.     Pulses: Normal pulses.          Radial pulses are 2+ on the right side and 2+ on the left side.       Posterior tibial pulses are 2+ on the right side and 2+ on the left side.     Heart sounds: Normal heart sounds.     Comments: Tactile temperature in the extremities appropriate and equal bilaterally. Pulmonary:     Effort: Pulmonary effort is normal. No respiratory distress.     Breath sounds: Wheezing present.     Comments: Very slight, expiratory wheeze in the bilateral lungs, but otherwise air movement is quite good. On subsequent evaluation, good air movement continues and wheezing has resolved. No increased work of breathing.  Speaks in full sentences without difficulty. Abdominal:     Palpations: Abdomen is soft.     Tenderness: There is no abdominal tenderness. There is no guarding.  Musculoskeletal:     Cervical back: Neck supple.     Right lower leg: No edema.     Left lower leg: No edema.  Lymphadenopathy:     Cervical: No cervical adenopathy.  Skin:    General: Skin is warm and dry.  Neurological:     Mental Status: He is alert.  Psychiatric:        Mood and Affect: Mood and affect normal.        Speech: Speech normal.        Behavior: Behavior normal.     ED Results / Procedures / Treatments   Labs (all labs ordered are listed, but only abnormal results are displayed) Labs Reviewed  NOVEL CORONAVIRUS, NAA (HOSP ORDER, SEND-OUT TO REF LAB; TAT 18-24 HRS)    EKG EKG Interpretation  Date/Time:  Wednesday January 10 2020 11:24:54  EST Ventricular Rate:  93 PR Interval:    QRS Duration: 108 QT Interval:  348 QTC Calculation: 433 R Axis:   10 Text Interpretation: Sinus rhythm Consider left atrial enlargement Nonspecific T abnormalities, lateral leads since last tracing no significant change Confirmed by Mancel Bale [phone]) on 01/10/2020 11:29:03 AM   Radiology DG Chest 2 View  Result Date: 01/10/2020 CLINICAL DATA:  Wheezing EXAM: CHEST - 2 VIEW COMPARISON:  Apr 27, 2019 FINDINGS: The lungs are  clear. The heart size and pulmonary vascularity are normal. No adenopathy. No bone lesions. IMPRESSION: Lungs clear.  Cardiac silhouette normal.  No adenopathy. Electronically Signed   By: Bretta Bang III M.D.   On: 01/10/2020 12:59    Procedures Procedures (including critical care time)  Medications Ordered in ED Medications  albuterol (VENTOLIN HFA) 108 (90 Base) MCG/ACT inhaler 2 puff (2 puffs Inhalation Given 01/10/20 1216)  predniSONE (DELTASONE) tablet 60 mg (60 mg Oral Given 01/10/20 1217)    ED Course  I have reviewed the triage vital signs and the nursing notes.  Pertinent labs & imaging results that were available during my care of the patient were reviewed by me and considered in my medical decision making (see chart for details).  Clinical Course as of Jan 09 1346  Wed Jan 10, 2020  1145 Noted to be 95-96% with good waveform during my evaluation.  SpO2: 92 % [SJ]    Clinical Course User Index [SJ] Zan Orlick, Hillard Danker, PA-C   MDM Rules/Calculators/A&P                      Patient presents with several months of intermittent shortness of breath and wheezing, especially at night or with weather changes.  Patient is nontoxic appearing, afebrile, not tachycardic, not tachypneic, not hypotensive, maintains adequate SPO2 on room air, and is in no apparent distress.  Symptoms suggest possible lung disease, such as asthma.  He will need official evaluation for this.  In the meantime, I have initiated some  therapies that I suspect could help the patient with his symptoms. The patient was given instructions for home care as well as return precautions. Patient voices understanding of these instructions, accepts the plan, and is comfortable with discharge.  Vitals:   01/10/20 1115 01/10/20 1116 01/10/20 1200  BP:  (!) 167/90 (!) 149/88  Pulse:  94 90  Resp:  18 15  Temp:  98.6 F (37 C)   TempSrc:  Oral   SpO2:  92% 94%  Weight: 136.1 kg    Height: 6' (1.829 m)       Final Clinical Impression(s) / ED Diagnoses Final diagnoses:  Wheezing    Rx / DC Orders ED Discharge Orders         Ordered    predniSONE (STERAPRED UNI-PAK 21 TAB) 10 MG (21) TBPK tablet     01/10/20 1256    montelukast (SINGULAIR) 10 MG tablet  Daily at bedtime     01/10/20 1256    albuterol (VENTOLIN HFA) 108 (90 Base) MCG/ACT inhaler  Every 6 hours PRN     01/10/20 1256    fluticasone (FLONASE) 50 MCG/ACT nasal spray  Daily     01/10/20 1256           Anselm Pancoast, PA-C 01/10/20 1347    Mancel Bale, MD 01/15/20 [phone]

## 2020-01-10 NOTE — Discharge Instructions (Addendum)
Chest x-ray was without any abnormalities or signs of infection.  Your pattern of wheezing and shortness of breath may be due to a pulmonary disease, such as asthma.  This takes further investigation for diagnosis.  Please follow-up with a primary care provider or pulmonologist (lung specialist) as soon as possible on this matter.  Call to make an appointment.  Montelukast: Montelukast (generic for Singulair) is a medication to help reduce the number of asthma reactions to triggers in the environment.  This medication should be taken daily regardless of how you feel.  General Viral Syndrome Care Instructions:  Your congestion symptoms are likely consistent with a viral illness. Viruses do not require or respond to antibiotics. Treatment is symptomatic care and it is important to note that these symptoms may last for 7-14 days.   Hand washing: Wash your hands throughout the day, but especially before and after touching the face, using the restroom, sneezing, coughing, or touching surfaces that have been coughed or sneezed upon. Hydration: Symptoms of most illnesses will be intensified and complicated by dehydration. Dehydration can also extend the duration of symptoms. Drink plenty of fluids and get plenty of rest. You should be drinking at least half a liter of water an hour to stay hydrated. Electrolyte drinks (ex. Gatorade, Powerade, Pedialyte) are also encouraged. You should be drinking enough fluids to make your urine light yellow, almost clear. If this is not the case, you are not drinking enough water. Please note that some of the treatments indicated below will not be effective if you are not adequately hydrated. Albuterol: May use the albuterol as needed for instances of shortness of breath. Prednisone: Take the prednisone, as directed, in its entirety. Zyrtec or Claritin: May add these medication daily to control underlying symptoms of congestion, sneezing, and other signs of allergies.  These  medications are available over-the-counter. Generics: Cetirizine (generic for Zyrtec) and loratadine (generic for Claritin). Fluticasone: Use fluticasone (generic for Flonase), as directed, for nasal and sinus congestion.  This medication is available over-the-counter. Congestion: Plain guaifenesin (generic for plain Mucinex) may help relieve congestion. Saline sinus rinses and saline nasal sprays may also help relieve congestion. If you do not have high blood pressure, heart problems, or an allergy to such medications, you may also try phenylephrine or Sudafed. Sore throat: Warm liquids or Chloraseptic spray may help soothe a sore throat. Gargle twice a day with a salt water solution made from a half teaspoon of salt in a cup of warm water.  Follow up: Follow up with a primary care provider within the next two weeks should symptoms fail to resolve. Return: Return to the ED for significantly worsening symptoms, shortness of breath, persistent vomiting, large amounts of blood in stool, or any other major concerns.  For prescription assistance, may try using prescription discount sites or apps, such as goodrx.com

## 2020-01-11 LAB — NOVEL CORONAVIRUS, NAA (HOSP ORDER, SEND-OUT TO REF LAB; TAT 18-24 HRS): SARS-CoV-2, NAA: NOT DETECTED

## 2020-01-22 ENCOUNTER — Other Ambulatory Visit: Payer: Self-pay

## 2020-01-22 ENCOUNTER — Encounter: Payer: Self-pay | Admitting: Emergency Medicine

## 2020-01-22 ENCOUNTER — Ambulatory Visit (INDEPENDENT_AMBULATORY_CARE_PROVIDER_SITE_OTHER): Payer: Self-pay | Admitting: Emergency Medicine

## 2020-01-22 VITALS — BP 128/82 | HR 115 | Ht 72.0 in | Wt 306.0 lb

## 2020-01-22 DIAGNOSIS — R0981 Nasal congestion: Secondary | ICD-10-CM

## 2020-01-22 DIAGNOSIS — R0600 Dyspnea, unspecified: Secondary | ICD-10-CM | POA: Insufficient documentation

## 2020-01-22 DIAGNOSIS — R0602 Shortness of breath: Secondary | ICD-10-CM

## 2020-01-22 DIAGNOSIS — R0683 Snoring: Secondary | ICD-10-CM | POA: Insufficient documentation

## 2020-01-22 MED ORDER — FLUTICASONE PROPIONATE 50 MCG/ACT NA SUSP: 2.0000 | Freq: Every day | NASAL | 5 refills | Status: DC

## 2020-01-22 MED ORDER — LORATADINE 10 MG PO TABS: 10.0000 mg | ORAL_TABLET | Freq: Every day | ORAL | 5 refills | Status: DC

## 2020-01-22 NOTE — Assessment & Plan Note (Signed)
As mentioned above he does have findings characteristic of obstructive sleep apnea.  He is willing to have a sleep study, consider CPAP if indicated.  I will arrange for a split-night study to be done in March once he has gotten his health insurance.

## 2020-01-22 NOTE — Patient Instructions (Signed)
Stop using Afrin nasal spray Start using fluticasone nasal spray, 2 sprays each nostril once daily Try starting loratadine 10 mg (Claritin) once daily Keep your albuterol available to use 2 puffs if you need it for shortness of breath, chest tightness, wheezing. We will arrange for pulmonary function testing in March We will arrange for a split-night sleep study in March Follow with Dr Delton Coombes in March after your testing so that we can review together.

## 2020-01-22 NOTE — Assessment & Plan Note (Signed)
He describes to distinct types of shortness of breath.  First he has noticed some nasal obstruction, upper airway noise when he goes to bed, has been awakened with gasping for breath.  I suspect that he has some degree of obstructive sleep apnea based on his description.  Second he experiences some chest tightness and dyspnea, it can happen when he lays down for bed but also when exposed to secondhand smoke, exposed to cold air.  This sounds more like obstructive lung disease.  He is a former smoker, continues to vape.  I think he needs full pulmonary function testing.  He will have insurance in March 2021 and we will aim to arrange for testing after that time.  In the meantime he can use albuterol as needed

## 2020-01-22 NOTE — Assessment & Plan Note (Signed)
He has been using Afrin intermittently, cautioned him against this due to rebound effect.  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  We will try loratadine, fluticasone nasal spray.

## 2020-01-22 NOTE — Progress Notes (Signed)
Subjective:    Patient ID: James Adams, male    DOB: 02/23/1976, 44 y.o.   MRN: [id number]  HPI 44 year old former smoker (33 pack years), current daily vaper, with a history of allergic rhinitis.  He is referred today for dyspnea and wheezing.  He describes shortness of breath that he notices the most at night, associated with some noise / wheeze, tightness in his chest.  Also happens when he is exposed to cold weather. Relieved by SABA. Marland Kitchen He can have some dull mid chest pain while awake - not necessarily associated with other reflux sx. Can get a bit out of breath with carrying and walking stairs. Otherwise he has good exercise tolerance. Still has 2nd hand smoke exposure   He wakes with HA's, never feels rested. He snores, has had some gasping episodes at night. He has gained 30 lbs since 8 months.   Was seen in the emergency department on 01/10/2020 after he woke from sleep gasping for air, used a family member's inhaler. Based on symptoms was written for prednisone, Singulair, fluticasone nasal spray; he only filled albuterol. He also has some Afrin.    Review of Systems  Constitutional: Negative for activity change, appetite change, chills, diaphoresis, fatigue, fever and unexpected weight change.  HENT: Negative for congestion, dental problem, nosebleeds, postnasal drip, rhinorrhea, sinus pressure, sneezing, trouble swallowing and voice change.   Eyes: Negative for itching and visual disturbance.  Respiratory: Positive for shortness of breath and wheezing. Negative for cough, choking, chest tightness and stridor.   Cardiovascular: Negative for chest pain, palpitations and leg swelling.  Gastrointestinal: Negative for abdominal pain.  Musculoskeletal: Negative for joint swelling and myalgias.  Skin: Negative for rash.  Neurological: Negative for syncope, light-headedness and headaches.  Psychiatric/Behavioral: Negative for sleep disturbance.    No past medical history on file.   No  family history on file.   Social History   Socioeconomic History  . Marital status: Married    Spouse name: Not on file  . Number of children: Not on file  . Years of education: Not on file  . Highest education level: Not on file  Occupational History  . Not on file  Tobacco Use  . Smoking status: Former Smoker    Packs/day: 1.50    Years: 22.00    Pack years: 33.00    Types: Cigarettes    Quit date: 12/21/2017    Years since quitting: 2.0  . Smokeless tobacco: Never Used  Substance and Sexual Activity  . Alcohol use: Not on file  . Drug use: Not on file  . Sexual activity: Not on file  Other Topics Concern  . Not on file  Social History Narrative  . Not on file   Social Determinants of Health   Financial Resource Strain:   . Difficulty of Paying Living Expenses: Not on file  Food Insecurity:   . Worried About Charity fundraiser in the Last Year: Not on file  . Ran Out of Food in the Last Year: Not on file  Transportation Needs:   . Lack of Transportation (Medical): Not on file  . Lack of Transportation (Non-Medical): Not on file  Physical Activity:   . Days of Exercise per Week: Not on file  . Minutes of Exercise per Session: Not on file  Stress:   . Feeling of Stress : Not on file  Social Connections:   . Frequency of Communication with Friends and Family: Not on file  . Frequency  of Social Gatherings with Friends and Family: Not on file  . Attends Religious Services: Not on file  . Active Member of Clubs or Organizations: Not on file  . Attends Banker Meetings: Not on file  . Marital Status: Not on file  Intimate Partner Violence:   . Fear of Current or Ex-Partner: Not on file  . Emotionally Abused: Not on file  . Physically Abused: Not on file  . Sexually Abused: Not on file  has lived in 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 albuterol (VENTOLIN HFA) 108 (90 Base) MCG/ACT inhaler Inhale 1-2 puffs into the lungs every 6 (six) hours as needed for wheezing or shortness of breath. 18 g 1  . Cetirizine HCl 10 MG CAPS Take 1 capsule (10 mg total) by mouth daily. 15 capsule 0  . clotrimazole (LOTRIMIN) 1 % cream Apply to affected area 2 times daily 24 g 0  . cyclobenzaprine (FLEXERIL) 10 MG tablet Take 1 tablet (10 mg total) by mouth 3 (three) times daily. 20 tablet 0  . diclofenac (VOLTAREN) 75 MG EC tablet Take 1 tablet (75 mg total) by mouth 2 (two) times daily. 12 tablet 0  . fluticasone (FLONASE) 50 MCG/ACT nasal spray Place 2 sprays into both nostrils daily. 16 g 2  . naproxen (NAPROSYN) 500 MG tablet Take 1 tablet (500 mg total) by mouth 2 (two) times daily. 30 tablet 0  . traMADol (ULTRAM) 50 MG tablet Take 1 tablet (50 mg total) by mouth every 6 (six) hours as needed. 12 tablet 0  . montelukast (SINGULAIR) 10 MG tablet Take 1 tablet (10 mg total) by mouth at bedtime. (Patient not taking: Reported on 01/22/2020) 30 tablet 2  . predniSONE (STERAPRED UNI-PAK 21 TAB) 10 MG (21) TBPK tablet Take 6 tabs (60mg ) day 1, 5 tabs (50mg ) day 2, 4 tabs (40mg ) day 3, 3 tabs (30mg ) day 4, 2 tabs (20mg ) day 5, and 1 tab (10mg ) day 6. 21 tablet 0   No facility-administered medications prior to visit.        Objective:   Physical Exam Vitals:   01/22/20 1604  BP: 128/82  Pulse: (!) 115  SpO2: 96%  Weight: (!) 306 lb (138.8 kg)  Height: 6' (1.829 m)    Gen: Pleasant, overweight gentleman, in no distress,  normal affect  ENT: No lesions,  mouth clear, crowded posterior pharynx, large tonsils, mild erythema, no exudate  Neck: No JVD, no stridor  Lungs: No use of accessory muscles, no crackles or wheezing on normal respiration, no wheeze on forced expiration  Cardiovascular: RRR, heart sounds normal, no murmur or gallops, no peripheral  edema  Musculoskeletal: No deformities, no cyanosis or clubbing  Neuro: alert, awake, non focal  Skin: Warm, no lesions or rash      Assessment & Plan:  Dyspnea He describes to distinct types of shortness of breath.  First he has noticed some nasal obstruction, upper airway noise when he goes to bed, has been awakened with gasping for breath.  I suspect that he has some degree of obstructive sleep apnea based on his description.  Second he experiences some chest tightness and dyspnea, it can happen when he lays down for bed but also when exposed to secondhand smoke, exposed to cold air.  This  sounds more like obstructive lung disease.  He is a former smoker, continues to vape.  I think he needs full pulmonary function testing.  He will have insurance in March 2021 and we will aim to arrange for testing after that time.  In the meantime he can use albuterol as needed  Chronic nasal congestion He has been using Afrin intermittently, cautioned him against this due to rebound effect.  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  We will try loratadine, fluticasone nasal spray.  Snoring As mentioned above he does have findings characteristic of obstructive sleep apnea.  He is willing to have a sleep study, consider CPAP if indicated.  I will arrange for a split-night study to be done in March once he has gotten his health insurance.    Levy Pupa, MD, PhD 01/22/2020, 4:42 PM [facility] [phone] or if no answer [phone]

## 2020-01-23 ENCOUNTER — Other Ambulatory Visit (HOSPITAL_BASED_OUTPATIENT_CLINIC_OR_DEPARTMENT_OTHER): Payer: Self-pay

## 2020-02-19 ENCOUNTER — Other Ambulatory Visit (HOSPITAL_COMMUNITY): Payer: Self-pay

## 2020-02-21 ENCOUNTER — Encounter (HOSPITAL_BASED_OUTPATIENT_CLINIC_OR_DEPARTMENT_OTHER): Payer: Self-pay | Admitting: Pulmonary Disease

## 2020-02-29 ENCOUNTER — Telehealth: Payer: Self-pay | Admitting: Pulmonary Disease

## 2020-02-29 ENCOUNTER — Other Ambulatory Visit: Payer: Self-pay | Admitting: *Deleted

## 2020-02-29 NOTE — Telephone Encounter (Signed)
Returned call to patient He wanted to know how to proceed with ins. Pts ins started today. He was advised to bring cards with him to PFT appt. Nothing further needed.

## 2020-02-29 NOTE — Telephone Encounter (Signed)
Called patient and informed that he should still have 1 refill left on rescue inhaler. He just needs to call his pharmacy. Nothing further needed

## 2020-02-29 NOTE — Progress Notes (Signed)
Informed pt that sleep study will need to be precerted so he will need to bring ins card by as soon as he gets them. He says no problem and will bring by office.

## 2020-03-18 ENCOUNTER — Other Ambulatory Visit (HOSPITAL_COMMUNITY): Payer: Self-pay

## 2020-03-20 ENCOUNTER — Encounter (HOSPITAL_BASED_OUTPATIENT_CLINIC_OR_DEPARTMENT_OTHER): Payer: Self-pay | Admitting: Pulmonary Disease

## 2020-04-05 ENCOUNTER — Other Ambulatory Visit (HOSPITAL_COMMUNITY): Payer: Self-pay | Attending: Pulmonary Disease

## 2020-04-07 ENCOUNTER — Ambulatory Visit (HOSPITAL_BASED_OUTPATIENT_CLINIC_OR_DEPARTMENT_OTHER): Payer: Self-pay | Attending: Emergency Medicine | Admitting: Pulmonary Disease

## 2020-05-15 ENCOUNTER — Encounter (HOSPITAL_COMMUNITY): Payer: Self-pay | Admitting: Emergency Medicine

## 2020-05-15 ENCOUNTER — Other Ambulatory Visit: Payer: Self-pay

## 2020-05-15 ENCOUNTER — Emergency Department (HOSPITAL_COMMUNITY)
Admission: EM | Admit: 2020-05-15 | Discharge: 2020-05-16 | Disposition: A | Discharge status: Home / Self Care | Payer: BC Managed Care – PPO | Attending: Emergency Medicine | Admitting: Emergency Medicine

## 2020-05-15 ENCOUNTER — Emergency Department (HOSPITAL_COMMUNITY): Payer: BC Managed Care – PPO

## 2020-05-15 DIAGNOSIS — J45909 Unspecified asthma, uncomplicated: Secondary | ICD-10-CM | POA: Diagnosis not present

## 2020-05-15 DIAGNOSIS — R0981 Nasal congestion: Secondary | ICD-10-CM | POA: Diagnosis not present

## 2020-05-15 DIAGNOSIS — Z79899 Other long term (current) drug therapy: Secondary | ICD-10-CM | POA: Diagnosis not present

## 2020-05-15 DIAGNOSIS — R509 Fever, unspecified: Secondary | ICD-10-CM | POA: Insufficient documentation

## 2020-05-15 DIAGNOSIS — Z87891 Personal history of nicotine dependence: Secondary | ICD-10-CM | POA: Insufficient documentation

## 2020-05-15 DIAGNOSIS — R05 Cough: Secondary | ICD-10-CM | POA: Diagnosis present

## 2020-05-15 DIAGNOSIS — J069 Acute upper respiratory infection, unspecified: Secondary | ICD-10-CM | POA: Insufficient documentation

## 2020-05-15 DIAGNOSIS — Z20822 Contact with and (suspected) exposure to covid-19: Secondary | ICD-10-CM | POA: Diagnosis not present

## 2020-05-15 HISTORY — DX: Unspecified asthma, uncomplicated: J45.909

## 2020-05-15 LAB — CBC WITH DIFFERENTIAL/PLATELET
Abs Immature Granulocytes: 0.02 10*3/uL (ref 0.00–0.07)
Basophils Absolute: 0 10*3/uL (ref 0.0–0.1)
Basophils Relative: 0 %
Eosinophils Absolute: 0.2 10*3/uL (ref 0.0–0.5)
Eosinophils Relative: 2 %
HCT: 42.5 % (ref 39.0–52.0)
Hemoglobin: 14.2 g/dL (ref 13.0–17.0)
Immature Granulocytes: 0 %
Lymphocytes Relative: 39 %
Lymphs Abs: 4.4 10*3/uL — ABNORMAL HIGH (ref 0.7–4.0)
MCH: 30 pg (ref 26.0–34.0)
MCHC: 33.4 g/dL (ref 30.0–36.0)
MCV: 89.7 fL (ref 80.0–100.0)
Monocytes Absolute: 0.9 10*3/uL (ref 0.1–1.0)
Monocytes Relative: 8 %
Neutro Abs: 5.8 10*3/uL (ref 1.7–7.7)
Neutrophils Relative %: 51 %
Platelets: 370 10*3/uL (ref 150–400)
RBC: 4.74 MIL/uL (ref 4.22–5.81)
RDW: 13.4 % (ref 11.5–15.5)
WBC: 11.4 10*3/uL — ABNORMAL HIGH (ref 4.0–10.5)
nRBC: 0 % (ref 0.0–0.2)

## 2020-05-15 LAB — COMPREHENSIVE METABOLIC PANEL
ALT: 15 U/L (ref 0–44)
AST: 15 U/L (ref 15–41)
Albumin: 3.9 g/dL (ref 3.5–5.0)
Alkaline Phosphatase: 60 U/L (ref 38–126)
Anion gap: 11 (ref 5–15)
BUN: 11 mg/dL (ref 6–20)
CO2: 25 mmol/L (ref 22–32)
Calcium: 8.8 mg/dL — ABNORMAL LOW (ref 8.9–10.3)
Chloride: 101 mmol/L (ref 98–111)
Creatinine, Ser: 1.08 mg/dL (ref 0.61–1.24)
GFR calc Af Amer: >60 mL/min (ref >60)
GFR calc non Af Amer: >60 mL/min (ref >60)
Glucose, Bld: 126 mg/dL — ABNORMAL HIGH (ref 70–99)
Potassium: 3.4 mmol/L — ABNORMAL LOW (ref 3.5–5.1)
Sodium: 137 mmol/L (ref 135–145)
Total Bilirubin: 0.8 mg/dL (ref 0.3–1.2)
Total Protein: 7 g/dL (ref 6.5–8.1)

## 2020-05-15 LAB — SARS CORONAVIRUS 2 BY RT PCR (HOSPITAL ORDER, PERFORMED IN ~~LOC~~ HOSPITAL LAB): SARS Coronavirus 2: NEGATIVE

## 2020-05-15 MED ORDER — PREDNISONE 20 MG PO TABS
40.0000 mg | ORAL_TABLET | Freq: Every day | ORAL | 0 refills | Status: DC
Start: 2020-05-15 — End: 2020-06-24

## 2020-05-15 MED ORDER — BENZONATATE 200 MG PO CAPS
200.0000 mg | ORAL_CAPSULE | Freq: Three times a day (TID) | ORAL | 0 refills | Status: DC | PRN
Start: 2020-05-15 — End: 2020-06-24

## 2020-05-15 MED ORDER — PREDNISONE 50 MG PO TABS
60.0000 mg | ORAL_TABLET | Freq: Once | ORAL | Status: AC
  Administered 2020-05-16: 60 mg via ORAL
  Filled 2020-05-15: qty 1

## 2020-05-15 MED ORDER — ALBUTEROL SULFATE HFA 108 (90 BASE) MCG/ACT IN AERS
2.0000 | INHALATION_SPRAY | Freq: Once | RESPIRATORY_TRACT | Status: AC
  Administered 2020-05-15: 2 via RESPIRATORY_TRACT
  Filled 2020-05-15: qty 6.7

## 2020-05-15 NOTE — ED Provider Notes (Signed)
[facility] EMERGENCY DEPARTMENT Provider Note   CSN: [id number] Arrival date & time: 05/15/20  2055     History Chief Complaint  Patient presents with  . Cough    James Adams is a 44 y.o. male.  Patient with cough congestion fever  The history is provided by the patient. No language interpreter was used.  Cough Cough characteristics:  Productive Sputum characteristics:  Nondescript Severity:  Moderate Onset quality:  Sudden Timing:  Constant Progression:  Worsening Chronicity:  New Associated symptoms: no chest pain, no eye discharge, no headaches and no rash        Past Medical History:  Diagnosis Date  . Asthma     Patient Active Problem List   Diagnosis Date Noted  . Dyspnea 01/22/2020  . Chronic nasal congestion 01/22/2020  . Snoring 01/22/2020    History reviewed. No pertinent surgical history.     No family history on file.  Social History   Tobacco Use  . Smoking status: Former Smoker    Packs/day: 1.50    Years: 22.00    Pack years: 33.00    Types: Cigarettes    Quit date: 12/21/2017    Years since quitting: 2.4  . Smokeless tobacco: Never Used  Substance Use Topics  . Alcohol use: Not Currently    Comment: occ  . Drug use: Never    Home Medications Prior to Admission medications   Medication Sig Start Date End Date Taking? Authorizing Provider  albuterol (VENTOLIN HFA) 108 (90 Base) MCG/ACT inhaler Inhale 1-2 puffs into the lungs every 6 (six) hours as needed for wheezing or shortness of breath. 01/10/20   Joy, Shawn C, PA-C  fluticasone (FLONASE) 50 MCG/ACT nasal spray Place 2 sprays into both nostrils daily. 01/22/20   Leslye Peer, MD  loratadine (CLARITIN) 10 MG tablet Take 1 tablet (10 mg total) by mouth daily. 01/22/20   Leslye Peer, MD  montelukast (SINGULAIR) 10 MG tablet Take 1 tablet (10 mg total) by mouth at bedtime. Patient not taking: Reported on 01/22/2020 01/10/20 04/09/20  Anselm Pancoast, PA-C    Allergies    Patient has  no known allergies.  Review of Systems   Review of Systems  Constitutional: Negative for appetite change and fatigue.  HENT: Negative for congestion, ear discharge and sinus pressure.   Eyes: Negative for discharge.  Respiratory: Positive for cough.   Cardiovascular: Negative for chest pain.  Gastrointestinal: Negative for abdominal pain and diarrhea.  Genitourinary: Negative for frequency and hematuria.  Musculoskeletal: Negative for back pain.  Skin: Negative for rash.  Neurological: Negative for seizures and headaches.  Psychiatric/Behavioral: Negative for hallucinations.    Physical Exam Updated Vital Signs BP (!) 123/113   Pulse (!) 109   Temp 98.2 F (36.8 C)   Resp 18   Ht 6' (1.829 m)   Wt 135.2 kg   SpO2 98%   BMI 40.42 kg/m   Physical Exam Vitals and nursing note reviewed.  Constitutional:      Appearance: He is well-developed.  HENT:     Head: Normocephalic.     Nose: Nose normal.  Eyes:     General: No scleral icterus.    Conjunctiva/sclera: Conjunctivae normal.  Neck:     Thyroid: No thyromegaly.  Cardiovascular:     Rate and Rhythm: Normal rate and regular rhythm.     Heart sounds: No murmur. No friction rub. No gallop.   Pulmonary:     Breath sounds: No stridor.  Wheezing present. No rales.  Chest:     Chest wall: No tenderness.  Abdominal:     General: There is no distension.     Tenderness: There is no abdominal tenderness. There is no rebound.  Musculoskeletal:        General: Normal range of motion.     Cervical back: Neck supple.  Lymphadenopathy:     Cervical: No cervical adenopathy.  Skin:    Findings: No erythema or rash.  Neurological:     Mental Status: He is alert and oriented to person, place, and time.     Motor: No abnormal muscle tone.     Coordination: Coordination normal.  Psychiatric:        Behavior: Behavior normal.     ED Results / Procedures / Treatments   Labs (all labs ordered are listed, but only abnormal  results are displayed) Labs Reviewed  CBC WITH DIFFERENTIAL/PLATELET - Abnormal; Notable for the following components:      Result Value   WBC 11.4 (*)    Lymphs Abs 4.4 (*)    All other components within normal limits  SARS CORONAVIRUS 2 BY RT PCR (HOSPITAL ORDER, [facility])  COMPREHENSIVE METABOLIC PANEL    EKG None  Radiology DG Chest Port 1 View  Result Date: 05/15/2020 CLINICAL DATA:  Weakness, cough and fever with body aches since Sunday EXAM: PORTABLE CHEST 1 VIEW COMPARISON:  Radiograph 01/10/2020 FINDINGS: Low lung volumes with central vascular crowding and mild vascular congestion when compared to prior. No focal consolidative process, convincing features of frank edema, pneumothorax or effusion. Cardiac silhouette is borderline enlarged, possibly accentuated by portable technique. Remaining cardiomediastinal contours are unremarkable. No acute osseous or soft tissue abnormality. IMPRESSION: 1. Low lung volumes with central vascular crowding and mild vascular congestion when compared to prior. No focal consolidative process, pneumothorax or effusion. 2. Borderline cardiomegaly, possibly accentuated by portable technique. Electronically Signed   By: Lovena Le M.D.   On: 05/15/2020 22:14    Procedures Procedures (including critical care time)  Medications Ordered in ED Medications  albuterol (VENTOLIN HFA) 108 (90 Base) MCG/ACT inhaler 2 puff (2 puffs Inhalation Given 05/15/20 2210)    ED Course  I have reviewed the triage vital signs and the nursing notes.  Pertinent labs & imaging results that were available during my care of the patient were reviewed by me and considered in my medical decision making (see chart for details).    MDM Rules/Calculators/A&P                      Patient with cough and congestion.  Covid test pending.        This patient presents to the ED for concern of cough congestion this involves an extensive number  of treatment options, and is a complaint that carries with it a high risk of complications and morbidity.  The differential diagnosis consists of pneumonia Covid and bronchitis   Lab Tests:   I Ordered, reviewed, and interpreted labs, which included CBC and chemistries which showed leukocytosis  Medicines ordered:   I ordered medication albuterol for shortness of breath  Imaging Studies ordered:   I ordered imaging studies which included chest x-ray and  I independently visualized and interpreted imaging which showed unremarkable  Additional history obtained:   Additional history obtained from records  Previous records obtained and reviewed   Consultations Obtained:   I consulted Dr. Mariane Baumgarten emergency medicine physician and  discussed lab and imaging findings  Reevaluation:  After the interventions stated above, I reevaluated the patient and found improved  Critical Interventions:  .   Final Clinical Impression(s) / ED Diagnoses Final diagnoses:  None    Rx / DC Orders ED Discharge Orders    None       Bethann Berkshire, MD 05/16/20 1103

## 2020-05-15 NOTE — ED Provider Notes (Signed)
Patient signed out to me by Dr. Estell Harpin to follow-up on work-up.  Chest x-ray does not show pneumonia.  Covid was negative.  Lab work unremarkable.  Recheck reveals clear lungs without any active wheezing.  Will treat with prednisone as he does have a history of asthma.  Continue albuterol.   Gilda Crease, MD 05/15/20 2358

## 2020-05-15 NOTE — ED Triage Notes (Signed)
Pt c/o cough, fever, and body aches since Sunday.

## 2020-06-24 ENCOUNTER — Other Ambulatory Visit: Payer: Self-pay

## 2020-06-24 ENCOUNTER — Emergency Department (HOSPITAL_COMMUNITY)
Admission: EM | Admit: 2020-06-24 | Discharge: 2020-06-24 | Disposition: A | Discharge status: Home / Self Care | Payer: BC Managed Care – PPO | Attending: Emergency Medicine | Admitting: Emergency Medicine

## 2020-06-24 ENCOUNTER — Encounter (HOSPITAL_COMMUNITY): Payer: Self-pay | Admitting: *Deleted

## 2020-06-24 DIAGNOSIS — J45909 Unspecified asthma, uncomplicated: Secondary | ICD-10-CM | POA: Diagnosis not present

## 2020-06-24 DIAGNOSIS — R739 Hyperglycemia, unspecified: Secondary | ICD-10-CM | POA: Insufficient documentation

## 2020-06-24 DIAGNOSIS — Z87891 Personal history of nicotine dependence: Secondary | ICD-10-CM | POA: Diagnosis not present

## 2020-06-24 DIAGNOSIS — K140 Glossitis: Secondary | ICD-10-CM | POA: Diagnosis not present

## 2020-06-24 DIAGNOSIS — R21 Rash and other nonspecific skin eruption: Secondary | ICD-10-CM | POA: Diagnosis not present

## 2020-06-24 DIAGNOSIS — E1165 Type 2 diabetes mellitus with hyperglycemia: Secondary | ICD-10-CM | POA: Diagnosis not present

## 2020-06-24 LAB — CBG MONITORING, ED: Glucose-Capillary: 223 mg/dL — ABNORMAL HIGH (ref 70–99)

## 2020-06-24 MED ORDER — NYSTATIN 100000 UNIT/ML MT SUSP
500000.0000 [IU] | Freq: Four times a day (QID) | OROMUCOSAL | 0 refills | Status: DC
Start: 2020-06-24 — End: 2020-09-01

## 2020-06-24 NOTE — ED Provider Notes (Signed)
[facility] EMERGENCY DEPARTMENT Provider Note   CSN: [id number] Arrival date & time: 06/24/20  3016     History Chief Complaint  Patient presents with  . Rash    James Adams is a 44 y.o. male.  HPI    Patient presents for tongue pain.  Patient reports over the past several days he has had pain on his tongue.  Also reports there might be a rash.  His wife thought he might have thrush.  No fevers or vomiting.  No difficulty swallowing.  No sore throat.  His course is worsening No known history of diabetes.  He has not used any inhaled steroids recently  Past Medical History:  Diagnosis Date  . Asthma     Patient Active Problem List   Diagnosis Date Noted  . Dyspnea 01/22/2020  . Chronic nasal congestion 01/22/2020  . Snoring 01/22/2020    History reviewed. No pertinent surgical history.     No family history on file.  Social History   Tobacco Use  . Smoking status: Former Smoker    Packs/day: 1.50    Years: 22.00    Pack years: 33.00    Types: Cigarettes    Quit date: 12/21/2017    Years since quitting: 2.5  . Smokeless tobacco: Never Used  Vaping Use  . Vaping Use: Every day  Substance Use Topics  . Alcohol use: Not Currently    Comment: occ  . Drug use: Never    Home Medications Prior to Admission medications   Medication Sig Start Date End Date Taking? Authorizing Provider  albuterol (VENTOLIN HFA) 108 (90 Base) MCG/ACT inhaler Inhale 1-2 puffs into the lungs every 6 (six) hours as needed for wheezing or shortness of breath. 01/10/20   Joy, Shawn C, PA-C  fluticasone (FLONASE) 50 MCG/ACT nasal spray Place 2 sprays into both nostrils daily. 01/22/20   Leslye Peer, MD  nystatin (MYCOSTATIN) 100000 UNIT/ML suspension Take 5 mLs (500,000 Units total) by mouth 4 (four) times daily. 06/24/20   Zadie Rhine, MD  loratadine (CLARITIN) 10 MG tablet Take 1 tablet (10 mg total) by mouth daily. 01/22/20 06/24/20  Leslye Peer, MD  montelukast (SINGULAIR) 10 MG  tablet Take 1 tablet (10 mg total) by mouth at bedtime. Patient not taking: Reported on 01/22/2020 01/10/20 06/24/20  Anselm Pancoast, PA-C    Allergies    Patient has no known allergies.  Review of Systems   Review of Systems  Constitutional: Negative for fever.  HENT: Positive for mouth sores.   Gastrointestinal: Negative for vomiting.    Physical Exam Updated Vital Signs BP (!) 128/92   Pulse 95   Temp 98 F (36.7 C) (Oral)   Resp 16   Ht 1.829 m (6')   Wt 131.5 kg   SpO2 95%   BMI 39.33 kg/m   Physical Exam CONSTITUTIONAL: Well developed/well nourished HEAD: Normocephalic/atraumatic EYES: EOMI/PERRL ENMT: Mucous membranes moist, no angioedema.  Uvula midline without erythema or exudates.  His tongue is coated with a greenish tint from a cupcake he ate.  Small white patch noted on the anterior portion of the tongue NECK: supple no meningeal signs CV: S1/S2 noted, no murmurs/rubs/gallops noted LUNGS: Lungs are clear to auscultation bilaterally, no apparent distress ABDOMEN: soft, nontender  NEURO: Pt is awake/alert/appropriate, moves all extremitiesx4.  No facial droop.  EXTREMITIES:   full ROM SKIN: warm, color normal PSYCH: no abnormalities of mood noted, alert and oriented to situation  ED Results / Procedures /  Treatments   Labs (all labs ordered are listed, but only abnormal results are displayed) Labs Reviewed  CBG MONITORING, ED - Abnormal; Notable for the following components:      Result Value   Glucose-Capillary 223 (*)    All other components within normal limits    EKG None  Radiology No results found.  Procedures Procedures    Medications Ordered in ED Medications - No data to display  ED Course  I have reviewed the triage vital signs and the nursing notes.  Pertinent labs  results that were available during my care of the patient were reviewed by me and considered in my medical decision making (see chart for details).    MDM  Rules/Calculators/A&P                          PT presents with tongue pain.  His wife thought he had thrush.  Unfortunately he had a cupcake several hours ago and he still has a green color on his tongue which limits the exam.  There was a small area of a white patch.  I think it is reasonable to treat the patient for thrush.  He has no signs of pharyngitis or any other acute emergency.  He does have elevated glucose without a diagnosis of diabetes.  This could be playing a role in his symptoms. He does not have a PCP.  Information was given for referral. Final Clinical Impression(s) / ED Diagnoses Final diagnoses:  Glossitis  Hyperglycemia    Rx / DC Orders ED Discharge Orders         Ordered    nystatin (MYCOSTATIN) 100000 UNIT/ML suspension  4 times daily     Discontinue  Reprint     06/24/20 0610           Zadie Rhine, MD 06/24/20 [phone]

## 2020-06-24 NOTE — ED Triage Notes (Signed)
Pt c/o rash to tongue for the past few days,

## 2020-09-01 ENCOUNTER — Encounter (HOSPITAL_COMMUNITY): Payer: Self-pay | Admitting: Emergency Medicine

## 2020-09-01 ENCOUNTER — Other Ambulatory Visit: Payer: Self-pay

## 2020-09-01 ENCOUNTER — Emergency Department (HOSPITAL_COMMUNITY)
Admission: EM | Admit: 2020-09-01 | Discharge: 2020-09-01 | Disposition: A | Discharge status: Home / Self Care | Payer: BC Managed Care – PPO | Attending: Emergency Medicine | Admitting: Emergency Medicine

## 2020-09-01 DIAGNOSIS — J45909 Unspecified asthma, uncomplicated: Secondary | ICD-10-CM | POA: Insufficient documentation

## 2020-09-01 DIAGNOSIS — K122 Cellulitis and abscess of mouth: Secondary | ICD-10-CM | POA: Diagnosis not present

## 2020-09-01 DIAGNOSIS — Z7951 Long term (current) use of inhaled steroids: Secondary | ICD-10-CM | POA: Insufficient documentation

## 2020-09-01 DIAGNOSIS — J029 Acute pharyngitis, unspecified: Secondary | ICD-10-CM | POA: Diagnosis not present

## 2020-09-01 DIAGNOSIS — Z79899 Other long term (current) drug therapy: Secondary | ICD-10-CM | POA: Diagnosis not present

## 2020-09-01 DIAGNOSIS — Z20822 Contact with and (suspected) exposure to covid-19: Secondary | ICD-10-CM | POA: Diagnosis not present

## 2020-09-01 LAB — SARS CORONAVIRUS 2 BY RT PCR (HOSPITAL ORDER, PERFORMED IN ~~LOC~~ HOSPITAL LAB): SARS Coronavirus 2: NEGATIVE

## 2020-09-01 LAB — GROUP A STREP BY PCR: Group A Strep by PCR: NOT DETECTED

## 2020-09-01 MED ORDER — PREDNISONE 50 MG PO TABS
ORAL_TABLET | ORAL | 0 refills | Status: DC
Start: 2020-09-01 — End: 2020-10-22

## 2020-09-01 MED ORDER — PREDNISONE 50 MG PO TABS
60.0000 mg | ORAL_TABLET | Freq: Once | ORAL | Status: AC
  Administered 2020-09-01: 60 mg via ORAL
  Filled 2020-09-01: qty 1

## 2020-09-01 NOTE — ED Notes (Signed)
Pt here for sore throat that began this am   He reports difficulty speaking and breathing   Uvula is swollen   covid and step swabs to lab

## 2020-09-01 NOTE — ED Triage Notes (Signed)
Pt c/o sore throat and swollen uvula that he noticed when he woke up this morning. Pt having difficulty swallowing and speaking. No difficulty controlling secretions at this time. Denies fever.

## 2020-09-01 NOTE — ED Provider Notes (Signed)
[facility] EMERGENCY DEPARTMENT Provider Note   CSN: [id number] Arrival date & time: 09/01/20  1427     History Chief Complaint  Patient presents with  . Sore Throat    James Adams is a 44 y.o. male.  The history is provided by the patient. No language interpreter was used.  Sore Throat This is a new problem. The current episode started 12 to 24 hours ago. The problem occurs constantly. The problem has not changed since onset.Nothing aggravates the symptoms. Nothing relieves the symptoms. He has tried nothing for the symptoms.   Pt reports uvula is swollen,  Pt does snore and he slept under a fan last night     Past Medical History:  Diagnosis Date  . Asthma     Patient Active Problem List   Diagnosis Date Noted  . Dyspnea 01/22/2020  . Chronic nasal congestion 01/22/2020  . Snoring 01/22/2020    History reviewed. No pertinent surgical history.     History reviewed. No pertinent family history.  Social History   Tobacco Use  . Smoking status: Former Smoker    Packs/day: 1.50    Years: 22.00    Pack years: 33.00    Types: Cigarettes    Quit date: 12/21/2017    Years since quitting: 2.6  . Smokeless tobacco: Never Used  Vaping Use  . Vaping Use: Every day  Substance Use Topics  . Alcohol use: Not Currently    Comment: occ  . Drug use: Never    Home Medications Prior to Admission medications   Medication Sig Start Date End Date Taking? Authorizing Provider  albuterol (VENTOLIN HFA) 108 (90 Base) MCG/ACT inhaler Inhale 1-2 puffs into the lungs every 6 (six) hours as needed for wheezing or shortness of breath. 01/10/20  Yes Joy, Shawn C, PA-C  aspirin-acetaminophen-caffeine (EXCEDRIN MIGRAINE) [phone] MG tablet Take 2 tablets by mouth every 6 (six) hours as needed for headache.   Yes [provider]  fluticasone (FLONASE) 50 MCG/ACT nasal spray Place 2 sprays into both nostrils daily. Patient taking differently: Place 2 sprays into both nostrils  daily as needed for allergies or rhinitis.  01/22/20  Yes Leslye Peer, MD  loratadine (CLARITIN) 10 MG tablet Take 1 tablet (10 mg total) by mouth daily. 01/22/20 06/24/20  Leslye Peer, MD  montelukast (SINGULAIR) 10 MG tablet Take 1 tablet (10 mg total) by mouth at bedtime. Patient not taking: Reported on 01/22/2020 01/10/20 06/24/20  Anselm Pancoast, PA-C    Allergies    Patient has no known allergies.  Review of Systems   Review of Systems  Constitutional: Negative for fever.  HENT: Positive for sore throat.   All other systems reviewed and are negative.   Physical Exam Updated Vital Signs BP 136/90 (BP Location: Right Arm)   Pulse 86   Temp 98.8 F (37.1 C) (Oral)   Resp 16   Ht 6' (1.829 m)   Wt 127 kg   SpO2 95%   BMI 37.97 kg/m   Physical Exam Vitals and nursing note reviewed.  Constitutional:      Appearance: He is well-developed.  HENT:     Head: Normocephalic.     Mouth/Throat:     Mouth: Mucous membranes are moist.     Pharynx: Uvula swelling present.  Pulmonary:     Effort: Pulmonary effort is normal.  Abdominal:     General: There is no distension.  Musculoskeletal:        General: Normal  range of motion.     Cervical back: Normal range of motion.  Skin:    General: Skin is warm.  Neurological:     Mental Status: He is alert and oriented to person, place, and time.     ED Results / Procedures / Treatments   Labs (all labs ordered are listed, but only abnormal results are displayed) Labs Reviewed  GROUP A STREP BY PCR  SARS CORONAVIRUS 2 BY RT PCR (HOSPITAL ORDER, PERFORMED IN [facility])    EKG None  Radiology No results found.  Procedures Procedures (including critical care time)  Medications Ordered in ED Medications - No data to display  ED Course  I have reviewed the triage vital signs and the nursing notes.  Pertinent labs & imaging results that were available during my care of the patient were reviewed by me and  considered in my medical decision making (see chart for details).    MDM Rules/Calculators/A&P                          Strep and covid are negative.  Pt given prednisone 60 mg here.  Rx for 50 mg for the next 4 days  Final Clinical Impression(s) / ED Diagnoses Final diagnoses:  Uvulitis    Rx / DC Orders ED Discharge Orders    None    An After Visit Summary was printed and given to the patient.    Elson Areas, Cordelia Poche 09/01/20 1703    Linwood Dibbles, MD 09/03/20 [phone]

## 2020-09-03 DIAGNOSIS — K122 Cellulitis and abscess of mouth: Secondary | ICD-10-CM | POA: Diagnosis not present

## 2020-09-23 ENCOUNTER — Emergency Department (HOSPITAL_COMMUNITY): Payer: PRIVATE HEALTH INSURANCE

## 2020-09-23 ENCOUNTER — Other Ambulatory Visit: Payer: Self-pay

## 2020-09-23 ENCOUNTER — Emergency Department (HOSPITAL_COMMUNITY)
Admission: EM | Admit: 2020-09-23 | Discharge: 2020-09-23 | Disposition: A | Discharge status: Home / Self Care | Payer: PRIVATE HEALTH INSURANCE | Attending: Emergency Medicine | Admitting: Emergency Medicine

## 2020-09-23 ENCOUNTER — Encounter (HOSPITAL_COMMUNITY): Payer: Self-pay | Admitting: Emergency Medicine

## 2020-09-23 DIAGNOSIS — J45909 Unspecified asthma, uncomplicated: Secondary | ICD-10-CM | POA: Insufficient documentation

## 2020-09-23 DIAGNOSIS — Z7982 Long term (current) use of aspirin: Secondary | ICD-10-CM | POA: Insufficient documentation

## 2020-09-23 DIAGNOSIS — L0291 Cutaneous abscess, unspecified: Secondary | ICD-10-CM

## 2020-09-23 DIAGNOSIS — L02215 Cutaneous abscess of perineum: Secondary | ICD-10-CM | POA: Diagnosis not present

## 2020-09-23 DIAGNOSIS — Z87891 Personal history of nicotine dependence: Secondary | ICD-10-CM | POA: Insufficient documentation

## 2020-09-23 LAB — CBC WITH DIFFERENTIAL/PLATELET
Abs Immature Granulocytes: 0.07 10*3/uL (ref 0.00–0.07)
Basophils Absolute: 0 10*3/uL (ref 0.0–0.1)
Basophils Relative: 0 %
Eosinophils Absolute: 0.3 10*3/uL (ref 0.0–0.5)
Eosinophils Relative: 2 %
HCT: 42.7 % (ref 39.0–52.0)
Hemoglobin: 14.1 g/dL (ref 13.0–17.0)
Immature Granulocytes: 1 %
Lymphocytes Relative: 41 %
Lymphs Abs: 4.4 10*3/uL — ABNORMAL HIGH (ref 0.7–4.0)
MCH: 29.8 pg (ref 26.0–34.0)
MCHC: 33 g/dL (ref 30.0–36.0)
MCV: 90.3 fL (ref 80.0–100.0)
Monocytes Absolute: 0.7 10*3/uL (ref 0.1–1.0)
Monocytes Relative: 7 %
Neutro Abs: 5.2 10*3/uL (ref 1.7–7.7)
Neutrophils Relative %: 49 %
Platelets: 383 10*3/uL (ref 150–400)
RBC: 4.73 MIL/uL (ref 4.22–5.81)
RDW: 13.7 % (ref 11.5–15.5)
WBC: 10.6 10*3/uL — ABNORMAL HIGH (ref 4.0–10.5)
nRBC: 0 % (ref 0.0–0.2)

## 2020-09-23 LAB — COMPREHENSIVE METABOLIC PANEL
ALT: 14 U/L (ref 0–44)
AST: 14 U/L — ABNORMAL LOW (ref 15–41)
Albumin: 4 g/dL (ref 3.5–5.0)
Alkaline Phosphatase: 79 U/L (ref 38–126)
Anion gap: 10 (ref 5–15)
BUN: 16 mg/dL (ref 6–20)
CO2: 26 mmol/L (ref 22–32)
Calcium: 9.3 mg/dL (ref 8.9–10.3)
Chloride: 101 mmol/L (ref 98–111)
Creatinine, Ser: 0.98 mg/dL (ref 0.61–1.24)
GFR calc Af Amer: >60 mL/min (ref >60)
GFR calc non Af Amer: >60 mL/min (ref >60)
Glucose, Bld: 154 mg/dL — ABNORMAL HIGH (ref 70–99)
Potassium: 3.8 mmol/L (ref 3.5–5.1)
Sodium: 137 mmol/L (ref 135–145)
Total Bilirubin: 0.2 mg/dL — ABNORMAL LOW (ref 0.3–1.2)
Total Protein: 7.3 g/dL (ref 6.5–8.1)

## 2020-09-23 MED ORDER — ONDANSETRON HCL 4 MG/2ML IJ SOLN
4.0000 mg | Freq: Once | INTRAMUSCULAR | Status: AC
  Administered 2020-09-23: 4 mg via INTRAVENOUS
  Filled 2020-09-23: qty 2

## 2020-09-23 MED ORDER — HYDROMORPHONE HCL 1 MG/ML IJ SOLN
1.0000 mg | Freq: Once | INTRAMUSCULAR | Status: AC
  Administered 2020-09-23: 1 mg via INTRAVENOUS
  Filled 2020-09-23: qty 1

## 2020-09-23 MED ORDER — IOHEXOL 300 MG/ML  SOLN
100.0000 mL | Freq: Once | INTRAMUSCULAR | Status: AC | PRN
  Administered 2020-09-23: 100 mL via INTRAVENOUS

## 2020-09-23 MED ORDER — HYDROCODONE-ACETAMINOPHEN 5-325 MG PO TABS: 1.0000 | ORAL_TABLET | ORAL | 0 refills | Status: DC | PRN

## 2020-09-23 MED ORDER — SULFAMETHOXAZOLE-TRIMETHOPRIM 800-160 MG PO TABS
1.0000 | ORAL_TABLET | Freq: Two times a day (BID) | ORAL | 0 refills | Status: DC
Start: 2020-09-23 — End: 2020-10-17

## 2020-09-23 MED ORDER — LIDOCAINE HCL (PF) 2 % IJ SOLN
10.0000 mL | Freq: Once | INTRAMUSCULAR | Status: AC
  Administered 2020-09-23: 10 mL

## 2020-09-23 MED ORDER — HYDROCODONE-ACETAMINOPHEN 5-325 MG PO TABS
2.0000 | ORAL_TABLET | Freq: Once | ORAL | Status: AC
  Administered 2020-09-23: 2 via ORAL
  Filled 2020-09-23: qty 2

## 2020-09-23 MED ORDER — SODIUM CHLORIDE 0.9 % IV SOLN
1.0000 g | Freq: Once | INTRAVENOUS | Status: AC
  Administered 2020-09-23: 1 g via INTRAVENOUS
  Filled 2020-09-23: qty 10

## 2020-09-23 MED ORDER — LIDOCAINE-EPINEPHRINE (PF) 2 %-1:200000 IJ SOLN
INTRAMUSCULAR | Status: AC
  Filled 2020-09-23: qty 20

## 2020-09-23 MED ORDER — FENTANYL CITRATE (PF) 100 MCG/2ML IJ SOLN
50.0000 ug | Freq: Once | INTRAMUSCULAR | Status: AC
  Administered 2020-09-23: 50 ug via INTRAVENOUS
  Filled 2020-09-23: qty 2

## 2020-09-23 MED ORDER — SODIUM CHLORIDE 0.9 % IV BOLUS
1000.0000 mL | Freq: Once | INTRAVENOUS | Status: AC
  Administered 2020-09-23: 1000 mL via INTRAVENOUS

## 2020-09-23 NOTE — ED Provider Notes (Signed)
INCISION AND DRAINAGE Performed by: Langston Masker Consent: Verbal consent obtained. Risks and benefits: risks, benefits and alternatives were discussed Type: abscess  Body area: groin   Anesthesia: local infiltration  Incision was made with a scalpel.  Local anesthetic: lidocaine 1  Anesthetic total: 15 ml  Complexity: 3 small stab incisions.  No drainage.  I attempted aspiration with 18 gauge with no drainage  Drainage:none  Drainage amount: none  Packing material:   Patient tolerance: Patient tolerated the procedure well with no immediate complications.  I discussed with Dr. Lovell Sheehan general surgeon who advised antibiotics and soaks.  He will see pt in his office on Thursday.  Pt counseled on plan   Osie Cheeks 09/23/20 1159    Bethann Berkshire, MD 09/25/20 [phone]

## 2020-09-23 NOTE — Discharge Instructions (Addendum)
Return if any problems. Soak area 20 minutes 5 times a day

## 2020-09-23 NOTE — ED Provider Notes (Signed)
[facility] EMERGENCY DEPARTMENT Provider Note   CSN: [id number] Arrival date & time: 09/23/20  0124   Time seen 4:34 AM  History Chief Complaint  Patient presents with  . Abscess    James Adams is a 44 y.o. male.  HPI   Patient states September 29 he started getting 2 kn in an area between his scrotum and his rectum where he has had abscesses before.  He states he has had probably 7-8 episodes of these abscesses.  The very first 1 had to be drained in the OR per patient but it sounded more like the ER and since the last several have drained at home with sitz baths.  He is unsure fever but denies chills.  He also states he is got swollen lymph nodes in his groin now.  He does not have diabetes.  PCP Patient, No Pcp Per   Past Medical History:  Diagnosis Date  . Asthma     Patient Active Problem List   Diagnosis Date Noted  . Dyspnea 01/22/2020  . Chronic nasal congestion 01/22/2020  . Snoring 01/22/2020    History reviewed. No pertinent surgical history.     History reviewed. No pertinent family history.  Social History   Tobacco Use  . Smoking status: Former Smoker    Packs/day: 1.50    Years: 22.00    Pack years: 33.00    Types: Cigarettes    Quit date: 12/21/2017    Years since quitting: 2.7  . Smokeless tobacco: Never Used  Vaping Use  . Vaping Use: Every day  Substance Use Topics  . Alcohol use: Not Currently    Comment: occ  . Drug use: Never  lives with spouse  Home Medications Prior to Admission medications   Medication Sig Start Date End Date Taking? Authorizing Provider  albuterol (VENTOLIN HFA) 108 (90 Base) MCG/ACT inhaler Inhale 1-2 puffs into the lungs every 6 (six) hours as needed for wheezing or shortness of breath. 01/10/20   Joy, Hillard DankerShawn C, PA-C  aspirin-acetaminophen-caffeine (EXCEDRIN MIGRAINE) [phone] MG tablet Take 2 tablets by mouth every 6 (six) hours as needed for headache.    [provider]  fluticasone (FLONASE) 50  MCG/ACT nasal spray Place 2 sprays into both nostrils daily. Patient taking differently: Place 2 sprays into both nostrils daily as needed for allergies or rhinitis.  01/22/20   Leslye PeerByrum, Robert S, MD  predniSONE (DELTASONE) 50 MG tablet One tablet a day for 4 days 09/01/20   Elson AreasSofia, Leslie K, PA-C  loratadine (CLARITIN) 10 MG tablet Take 1 tablet (10 mg total) by mouth daily. 01/22/20 06/24/20  Leslye PeerByrum, Robert S, MD  montelukast (SINGULAIR) 10 MG tablet Take 1 tablet (10 mg total) by mouth at bedtime. Patient not taking: Reported on 01/22/2020 01/10/20 06/24/20  Anselm PancoastJoy, Shawn C, PA-C    Allergies    Patient has no known allergies.  Review of Systems   Review of Systems  All other systems reviewed and are negative.   Physical Exam Updated Vital Signs BP 95/71 (BP Location: Left Arm)   Pulse (!) 104   Temp 99.8 F (37.7 C) (Oral)   Resp 18   Ht 6' (1.829 m)   Wt 127 kg   SpO2 95%   BMI 37.97 kg/m   Physical Exam Vitals and nursing note reviewed.  Constitutional:      General: He is not in acute distress.    Appearance: Normal appearance. He is obese. He is not ill-appearing or toxic-appearing.  HENT:     Head: Normocephalic and atraumatic.     Right Ear: External ear normal.     Left Ear: External ear normal.  Eyes:     Extraocular Movements: Extraocular movements intact.     Conjunctiva/sclera: Conjunctivae normal.  Cardiovascular:     Rate and Rhythm: Normal rate.  Pulmonary:     Effort: Pulmonary effort is normal. No respiratory distress.  Genitourinary:    Comments: Patient has area of induration just to the left of midline and the space between where the scrotum attaches to the groin and in the rectal area.  It appears to be the size of a plum. Musculoskeletal:        General: Normal range of motion.     Cervical back: Normal range of motion.  Skin:    General: Skin is warm and dry.     Findings: No erythema.  Neurological:     General: No focal deficit present.     Mental  Status: He is oriented to person, place, and time.     Cranial Nerves: No cranial nerve deficit.  Psychiatric:        Mood and Affect: Mood normal.        Behavior: Behavior normal.        Thought Content: Thought content normal.     ED Results / Procedures / Treatments   Labs (all labs ordered are listed, but only abnormal results are displayed) Results for orders placed or performed during the hospital encounter of 09/23/20  Comprehensive metabolic panel  Result Value Ref Range   Sodium 137 135 - 145 mmol/L   Potassium 3.8 3.5 - 5.1 mmol/L   Chloride 101 98 - 111 mmol/L   CO2 26 22 - 32 mmol/L   Glucose, Bld 154 (H) 70 - 99 mg/dL   BUN 16 6 - 20 mg/dL   Creatinine, Ser 8.25 0.61 - 1.24 mg/dL   Calcium 9.3 8.9 - 05.3 mg/dL   Total Protein 7.3 6.5 - 8.1 g/dL   Albumin 4.0 3.5 - 5.0 g/dL   AST 14 (L) 15 - 41 U/L   ALT 14 0 - 44 U/L   Alkaline Phosphatase 79 38 - 126 U/L   Total Bilirubin 0.2 (L) 0.3 - 1.2 mg/dL   GFR calc non Af Amer >60 >60 mL/min   GFR calc Af Amer >60 >60 mL/min   Anion gap 10 5 - 15  CBC with Differential  Result Value Ref Range   WBC 10.6 (H) 4.0 - 10.5 K/uL   RBC 4.73 4.22 - 5.81 MIL/uL   Hemoglobin 14.1 13.0 - 17.0 g/dL   HCT 97.6 39 - 52 %   MCV 90.3 80.0 - 100.0 fL   MCH 29.8 26.0 - 34.0 pg   MCHC 33.0 30.0 - 36.0 g/dL   RDW 73.4 19.3 - 79.0 %   Platelets 383 150 - 400 K/uL   nRBC 0.0 0.0 - 0.2 %   Neutrophils Relative % 49 %   Neutro Abs 5.2 1.7 - 7.7 K/uL   Lymphocytes Relative 41 %   Lymphs Abs 4.4 (H) 0.7 - 4.0 K/uL   Monocytes Relative 7 %   Monocytes Absolute 0.7 0 - 1 K/uL   Eosinophils Relative 2 %   Eosinophils Absolute 0.3 0 - 0 K/uL   Basophils Relative 0 %   Basophils Absolute 0.0 0 - 0 K/uL   Immature Granulocytes 1 %   Abs Immature Granulocytes 0.07 0.00 - 0.07  K/uL   Laboratory interpretation all normal except mild leukocytosis, hyperglycemia    EKG None  Radiology CT Abdomen Pelvis W Contrast  Result Date:  09/23/2020 CLINICAL DATA:  Perineal abscess. EXAM: CT ABDOMEN AND PELVIS WITH CONTRAST TECHNIQUE: Multidetector CT imaging of the abdomen and pelvis was performed using the standard protocol following bolus administration of intravenous contrast. CONTRAST:  OMNIPAQUE IOHEXOL 300 MG/ML  SOLN COMPARISON:  None. FINDINGS: Lower chest: Unremarkable. Hepatobiliary: No suspicious focal abnormality within the liver parenchyma. Tiny hypodensity in the lateral hepatic dome is too small to characterize at 6 mm but likely benign. Gallbladder is nondistended. No intrahepatic or extrahepatic biliary dilation. Pancreas: No focal mass lesion. No dilatation of the main duct. No intraparenchymal cyst. No peripancreatic edema. Spleen: No splenomegaly. No focal mass lesion. Adrenals/Urinary Tract: No adrenal nodule or mass. 17 mm water density lesion posterior lower pole right kidney is compatible with a cyst. Similar 17 mm water density lesion in the upper pole left kidney is most suggestive of a cyst. No evidence for hydroureter. The urinary bladder appears normal for the degree of distention. Stomach/Bowel: Stomach is unremarkable. No gastric wall thickening. No evidence of outlet obstruction. Duodenum is normally positioned as is the ligament of Treitz. No small bowel wall thickening. No small bowel dilatation. The terminal ileum is normal. The appendix is best seen on coronal images and is unremarkable. No gross colonic mass. No colonic wall thickening. Vascular/Lymphatic: No abdominal aortic aneurysm. No abdominal aortic atherosclerotic calcification. There is no gastrohepatic or hepatoduodenal ligament lymphadenopathy. No retroperitoneal or mesenteric lymphadenopathy. No pelvic sidewall lymphadenopathy. Upper normal to mildly enlarged groin nodes are evident bilaterally, measuring up to 16 mm short axis on the left (image 96/series 2). Reproductive: The prostate gland and seminal vesicles are unremarkable. Other: No  intraperitoneal free fluid. Musculoskeletal: 1.9 x 1.6 x 2.0 cm rim enhancing fluid collection is identified in the superficial subcutaneous fat, just deep to the skin of the left paramidline perineum. No worrisome lytic or sclerotic osseous abnormality. IMPRESSION: 1. 1.9 x 1.6 x 2.0 cm rim enhancing fluid collection in the superficial subcutaneous fat, just deep to the skin of the left paramidline perineum. Imaging features compatible with abscess. No associated soft tissue gas. 2. Upper normal to mildly enlarged groin nodes bilaterally, likely reactive. Follow-up CT in 3 months could be used to ensure resolution as clinically warranted. 3. Bilateral renal cysts. Electronically Signed   By: Kennith Center M.D.   On: 09/23/2020 07:00    Procedures Procedures (including critical care time)  Medications Ordered in ED Medications  sodium chloride 0.9 % bolus 1,000 mL (1,000 mLs Intravenous New Bag/Given 09/23/20 0455)  fentaNYL (SUBLIMAZE) injection 50 mcg (50 mcg Intravenous Given 09/23/20 0457)  ondansetron (ZOFRAN) injection 4 mg (4 mg Intravenous Given 09/23/20 0457)  iohexol (OMNIPAQUE) 300 MG/ML solution 100 mL (100 mLs Intravenous Contrast Given 09/23/20 4098)    ED Course  I have reviewed the triage vital signs and the nursing notes.  Pertinent labs & imaging results that were available during my care of the patient were reviewed by me and considered in my medical decision making (see chart for details).    MDM Rules/Calculators/A&P                          IV was inserted and patient was given IV pain and nausea medication.  CT of the abdomen/pelvis was done to see the extent of this abscess.  Patient denies any pain on defecation so hopefully it does not extend down into the perirectal area.  Patient left at change of shift with Dr. Estell Harpin to have abscess I&D.  Final Clinical Impression(s) / ED Diagnoses Final diagnoses:  None    Rx / DC Orders  Plan discharge  Devoria Albe, MD,  Concha Pyo, MD 09/23/20 [phone]

## 2020-09-23 NOTE — ED Notes (Signed)
ED Provider at bedside. 

## 2020-09-23 NOTE — ED Triage Notes (Signed)
Patient states that he has a large abscess between his scrotum and his anus that has developed since Wednesday this week.

## 2020-09-28 LAB — CULTURE, BLOOD (ROUTINE X 2)
Culture: NO GROWTH
Culture: NO GROWTH
Special Requests: ADEQUATE
Special Requests: ADEQUATE

## 2020-10-17 ENCOUNTER — Emergency Department (HOSPITAL_COMMUNITY)
Admission: EM | Admit: 2020-10-17 | Discharge: 2020-10-17 | Disposition: A | Discharge status: Home / Self Care | Payer: Self-pay | Attending: Emergency Medicine | Admitting: Emergency Medicine

## 2020-10-17 ENCOUNTER — Other Ambulatory Visit: Payer: Self-pay

## 2020-10-17 ENCOUNTER — Encounter (HOSPITAL_COMMUNITY): Payer: Self-pay | Admitting: Emergency Medicine

## 2020-10-17 DIAGNOSIS — Z7982 Long term (current) use of aspirin: Secondary | ICD-10-CM | POA: Insufficient documentation

## 2020-10-17 DIAGNOSIS — Z87891 Personal history of nicotine dependence: Secondary | ICD-10-CM | POA: Insufficient documentation

## 2020-10-17 DIAGNOSIS — L02215 Cutaneous abscess of perineum: Secondary | ICD-10-CM | POA: Insufficient documentation

## 2020-10-17 DIAGNOSIS — J45909 Unspecified asthma, uncomplicated: Secondary | ICD-10-CM | POA: Insufficient documentation

## 2020-10-17 MED ORDER — LIDOCAINE-EPINEPHRINE (PF) 2 %-1:200000 IJ SOLN
10.0000 mL | Freq: Once | INTRAMUSCULAR | Status: AC
  Administered 2020-10-17: 10 mL
  Filled 2020-10-17: qty 10

## 2020-10-17 MED ORDER — DOXYCYCLINE HYCLATE 100 MG PO CAPS
100.0000 mg | ORAL_CAPSULE | Freq: Two times a day (BID) | ORAL | 0 refills | Status: DC
Start: 2020-10-17 — End: 2020-10-22

## 2020-10-17 NOTE — ED Triage Notes (Signed)
Patient c/o abscess in perineal area that appeared last Friday and progressively getting bigger. Denies any drainage or fevers. Per patient has had reoccurring abscess in this area. Denies taking anything for pain.

## 2020-10-17 NOTE — Clinical Social Work Note (Signed)
Transition of Care [facility]) - Emergency Department Mini Assessment  Patient Details  Name: [name] MRN: [id number] Date of Birth: May 23, 1976  [facility] CM/SW Contact:    Ewing Schlein, LCSW Phone Number: 10/17/2020, 10:48 AM  Clinical Narrative: Patient is a 44 year old male who presented to the ED for a cyst. Per chart review, patient does not have a PCP. CSW spoke with patient regarding PCP resource list. Patient agreeable. CSW provided PCP list to patient. TOC signing off.  ED Mini Assessment: What brought you to the Emergency Department? : Cyst Barriers to Discharge: ED PCP establishment, ED Barriers Resolved Barrier interventions: Provided PCP resources Means of departure: Car Interventions which prevented an admission or readmission: Other (must enter comment) (PCP resources)  Patient Contact and Communications Choice offered to / list presented to : Patient  Admission diagnosis:  CYST Patient Active Problem List   Diagnosis Date Noted  . Dyspnea 01/22/2020  . Chronic nasal congestion 01/22/2020  . Snoring 01/22/2020   PCP:  Patient, No Pcp Per Pharmacy:   [id number] - [place], [place] - [address] AT [facility] [address] [place] [address] Phone: [phone] Fax: [phone]

## 2020-10-17 NOTE — Discharge Instructions (Addendum)
Take antibiotics as directed, the area should continue to drain over the next few days, continue with warm soaks 3-5 times daily to help promote drainage and healing.  Please follow-up with Dr. Henreitta Leber with general surgery for further evaluation of this recurrent abscess.  Return for new or worsening symptoms.  [facility] Primary Care Doctor List   Syliva Overman, MD. Specialty: [facility] Medicine Contact information: [address]  [place] [address]  [phone]   Lilyan Punt, MD. Specialty: [facility] Medicine Contact information: [address]  [place] [address]  [phone]   Avon Gully, MD Specialty: Internal Medicine Contact information: [address] [place] [address]  [phone]   Catalina Pizza, MD. Specialty: Internal Medicine Contact information: [address]  [place] [address]  [phone]    [facility] (Dr. Selena Batten) Specialty: Family Medicine Contact information: [address]  [place] [address]  [phone]   Cove Giovanni, MD. Specialty: [facility] Medicine Contact information: [address]  [address]  [place] [address]  [phone]   Carylon Perches, MD. Specialty: Internal Medicine Contact information: [address]  [address]  [place] [address]  [phone]    [facility]  [address] [place], [place] [address] [phone]  Services [facility] offers a variety of basic health services.  Services include but are not limited to: Blood pressure checks  Heart rate checks  Blood sugar checks  Urine analysis  Rapid strep tests  Pregnancy tests.  Health education and referrals  People needing more complex services will be directed to a physician online. Using these virtual visits, doctors can evaluate and prescribe medicine and treatments. There will be no medication on-site, though [facility] will help patients fill their prescriptions at little to no cost.   For More information please go to: [email or web address]

## 2020-10-17 NOTE — ED Provider Notes (Signed)
[facility] EMERGENCY DEPARTMENT Provider Note   CSN: [id number] Arrival date & time: 10/17/20  1031     History Chief Complaint  Patient presents with  . Abscess    James Adams is a 44 y.o. male.  James Adams is a 44 y.o. male with a history of asthma and recurrent abscess, who presents to the ED for evaluation of pain and some swelling in the perineal region that has been present since last Friday.  He states that he has had a recurrent abscess in this area that has occurred 8-9 times.  Most recently this occurred earlier this month and when he was seen they tried to drain it but could not get anything out, put him on antibiotics and then the eventually drained on its own.  He states that this time he tried to come in earlier because this usually becomes very painful and makes it difficult for him to walk.  He denies any fevers or chills.  No nausea or vomiting.  No dysuria or urinary frequency.  Denies any difficulty with bowel movements or pain with defecation.  No history of diabetes.  States that he was referred to a surgeon a month but has not followed up yet, and this area keeps coming back.        Past Medical History:  Diagnosis Date  . Asthma     Patient Active Problem List   Diagnosis Date Noted  . Dyspnea 01/22/2020  . Chronic nasal congestion 01/22/2020  . Snoring 01/22/2020    History reviewed. No pertinent surgical history.     Family History  Problem Relation Age of Onset  . Diabetes Mother   . Diabetes Father     Social History   Tobacco Use  . Smoking status: Former Smoker    Packs/day: 1.50    Years: 22.00    Pack years: 33.00    Types: Cigarettes    Quit date: 12/21/2017    Years since quitting: 2.8  . Smokeless tobacco: Never Used  Vaping Use  . Vaping Use: Every day  . Substances: Nicotine, Flavoring  Substance Use Topics  . Alcohol use: Not Currently    Comment: occ  . Drug use: Never    Home Medications Prior to Admission  medications   Medication Sig Start Date End Date Taking? Authorizing Provider  albuterol (VENTOLIN HFA) 108 (90 Base) MCG/ACT inhaler Inhale 1-2 puffs into the lungs every 6 (six) hours as needed for wheezing or shortness of breath. 01/10/20   Joy, Hillard Danker, PA-C  aspirin-acetaminophen-caffeine (EXCEDRIN MIGRAINE) [phone] MG tablet Take 2 tablets by mouth every 6 (six) hours as needed for headache.    [provider]  doxycycline (VIBRAMYCIN) 100 MG capsule Take 1 capsule (100 mg total) by mouth 2 (two) times daily. One po bid x 7 days 10/17/20   Dartha Lodge, PA-C  fluticasone [facility]) 50 MCG/ACT nasal spray Place 2 sprays into both nostrils daily. Patient taking differently: Place 2 sprays into both nostrils daily as needed for allergies or rhinitis.  01/22/20   Leslye Peer, MD  HYDROcodone-acetaminophen (NORCO/VICODIN) 5-325 MG tablet Take 1 tablet by mouth every 4 (four) hours as needed. 09/23/20   Elson Areas, PA-C  predniSONE (DELTASONE) 50 MG tablet One tablet a day for 4 days 09/01/20   Elson Areas, PA-C  loratadine (CLARITIN) 10 MG tablet Take 1 tablet (10 mg total) by mouth daily. 01/22/20 06/24/20  Leslye Peer, MD  montelukast (SINGULAIR) 10  MG tablet Take 1 tablet (10 mg total) by mouth at bedtime. Patient not taking: Reported on 01/22/2020 01/10/20 06/24/20  Anselm Pancoast, PA-C    Allergies    Patient has no known allergies.  Review of Systems   Review of Systems  Constitutional: Negative for chills and fever.  Gastrointestinal: Negative for constipation, diarrhea, nausea and rectal pain.  Genitourinary: Negative for dysuria and frequency.  Skin: Negative for color change and rash.       Abscess  All other systems reviewed and are negative.   Physical Exam Updated Vital Signs BP 125/88 (BP Location: Right Arm)   Pulse 100   Temp 98.5 F (36.9 C) (Oral)   Resp 18   Ht 6' (1.829 m)   Wt 127 kg   SpO2 97%   BMI 37.97 kg/m   Physical Exam Vitals and  nursing note reviewed.  Constitutional:      General: He is not in acute distress.    Appearance: Normal appearance. He is well-developed. He is not ill-appearing or diaphoretic.     Comments: Well-appearing and in no distress  HENT:     Head: Normocephalic and atraumatic.  Eyes:     General:        Right eye: No discharge.        Left eye: No discharge.  Pulmonary:     Effort: Pulmonary effort is normal. No respiratory distress.  Abdominal:     General: Abdomen is flat. There is no distension.     Palpations: Abdomen is soft.     Tenderness: There is no abdominal tenderness. There is no guarding.  Genitourinary:    Comments: Chaperone present during exam. There is tenderness and induration noted over the perineal region, there is not an obvious area of fluctuance, no expressible drainage.  Induration and pain does not extend towards the rectum or scrotum. Skin:    General: Skin is warm and dry.  Neurological:     Mental Status: He is alert and oriented to person, place, and time.     Coordination: Coordination normal.  Psychiatric:        Mood and Affect: Mood normal.        Behavior: Behavior normal.     ED Results / Procedures / Treatments   Labs (all labs ordered are listed, but only abnormal results are displayed) Labs Reviewed - No data to display  EKG None  Radiology No results found.  Procedures Ultrasound ED Soft Tissue  Date/Time: 10/17/2020 9:41 AM Performed by: Dartha Lodge, PA-C Authorized by: Dartha Lodge, PA-C   Procedure details:    Indications: localization of abscess     Transverse view:  Visualized   Longitudinal view:  Visualized   Images: archived     Limitations:  Positioning Location:    Location: perineum     Side:  Midline Findings:     abscess present .Marland KitchenIncision and Drainage  Date/Time: 10/17/2020 9:41 AM Performed by: Dartha Lodge, PA-C Authorized by: Dartha Lodge, PA-C   Consent:    Consent obtained:  Verbal    Consent given by:  Patient   Risks discussed:  Bleeding, incomplete drainage, pain, infection and damage to other organs   Alternatives discussed:  No treatment Location:    Type:  Abscess   Size:  2 x2 cm   Location:  Anogenital   Anogenital location:  Perineum Pre-procedure details:    Skin preparation:  Chloraprep Anesthesia (see MAR for exact dosages):  Anesthesia method:  Local infiltration   Local anesthetic:  Lidocaine 2% WITH epi Procedure type:    Complexity:  Complex Procedure details:    Needle aspiration: yes     Needle size:  25 G   Incision types:  Single straight   Incision depth:  Dermal   Scalpel blade:  11   Wound management:  Probed and deloculated   Drainage:  Serosanguinous   Drainage amount:  Moderate   Wound treatment:  Wound left open   Packing materials:  None Post-procedure details:    Patient tolerance of procedure:  Tolerated well, no immediate complications   (including critical care time)  Medications Ordered in ED Medications  lidocaine-EPINEPHrine (XYLOCAINE W/EPI) 2 %-1:200000 (PF) injection 10 mL (10 mLs Infiltration Given 10/17/20 1029)    ED Course  I have reviewed the triage vital signs and the nursing notes.  Pertinent labs & imaging results that were available during my care of the patient were reviewed by me and considered in my medical decision making (see chart for details).    MDM Rules/Calculators/A&P                         44 year old male presents with recurrent abscess to the perineum, he has had pain and swelling to this area for about 5 days now, this has recurred in the same location 8 or 9 times.  The last time he was seen on 10/4 he had a CT that showed a very small 2 x 2 centimeter fluid collection just below the skin, they attempted I&D but it was unsuccessful, patient states that he was placed on antibiotics and it opened up and drained on its own a few days later, he states this is what happens most often.  Given  recurrent abscesses feel patient would benefit from referral to general surgery.  Ultrasound today confirmed fluid collection, on exam there is no crepitus, induration does not track towards the scrotum or rectum patient has no pain with defecation.  No concern for perirectal abscess or Fournier's.  I&D performed with a moderate amount of cloudy serosanguineous drainage.  Will place patient on antibiotics and have him follow-up outpatient with Dr. Henreitta Leber with general surgery for further evaluation and management, concern that he may have a cyst in this location since it seems to come back repeatedly.  Discussed appropriate wound care encourage warm soaks and discussed strict return precautions.  Area was not large enough to warrant packing.  Placed on doxycycline given location.  Discharged home in good condition.  Final Clinical Impression(s) / ED Diagnoses Final diagnoses:  Perineal abscess    Rx / DC Orders ED Discharge Orders         Ordered    doxycycline (VIBRAMYCIN) 100 MG capsule  2 times daily        10/17/20 [address], [place] 10/17/20 1051    Vanetta Mulders, MD 10/18/20 2209

## 2020-10-22 ENCOUNTER — Ambulatory Visit (INDEPENDENT_AMBULATORY_CARE_PROVIDER_SITE_OTHER): Payer: Self-pay | Admitting: General Surgery

## 2020-10-22 ENCOUNTER — Other Ambulatory Visit: Payer: Self-pay | Admitting: Family Medicine

## 2020-10-22 ENCOUNTER — Other Ambulatory Visit: Payer: Self-pay

## 2020-10-22 ENCOUNTER — Encounter: Payer: Self-pay | Admitting: General Surgery

## 2020-10-22 VITALS — BP 131/89 | HR 84 | Temp 97.8°F | Resp 16 | Ht 72.0 in | Wt 292.0 lb

## 2020-10-22 DIAGNOSIS — L02215 Cutaneous abscess of perineum: Secondary | ICD-10-CM

## 2020-10-22 MED ORDER — DOXYCYCLINE HYCLATE 100 MG PO CAPS: 100.0000 mg | ORAL_CAPSULE | Freq: Two times a day (BID) | ORAL | 0 refills | Status: DC

## 2020-10-22 NOTE — Progress Notes (Signed)
[facility] History and Physical  Reason for Referral: Perineal abscess  Referring Physician:  ED   Chief Complaint    New Patient (Initial Visit); Hospitalization Follow-up      Gilford Lardizabal is a 44 y.o. male.  HPI:  Mr. 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 He says that the area normally ruptured on its on and he has had antibiotics given but had to undergo an I&D the other day in the ED. He is finishing up some doxycycline but had been given bactrim a month prior without true resolution of the area which then worsened and required the drainage.  Given the recurrent nature we was referred pending possible cyst that will need to be excised. He denies any abscess around his perianal region or this abscess tracking down to his anal region.  Past Medical History:  Diagnosis Date  . Asthma     History reviewed. No pertinent surgical history.  Family History  Problem Relation Age of Onset  . Diabetes Mother   . Diabetes Father     Social History   Tobacco Use  . Smoking status: Former Smoker    Packs/day: 1.50    Years: 22.00    Pack years: 33.00    Types: Cigarettes    Quit date: 12/21/2017    Years since quitting: 2.8  . Smokeless tobacco: Never Used  Vaping Use  . Vaping Use: Every day  . Substances: Nicotine, Flavoring  Substance Use Topics  . Alcohol use: Not Currently    Comment: occ  . Drug use: Never    Medications: I have reviewed the patient's current medications. Allergies as of 10/22/2020   No Known Allergies     Medication List       Accurate as of October 22, 2020 11:59 PM. If you have any questions, ask your nurse or doctor.        STOP taking these medications   predniSONE 50 MG tablet Commonly known as: DELTASONE Stopped by: Lucretia Roers, MD     TAKE these medications   albuterol 108 (90 Base) MCG/ACT inhaler Commonly known as: VENTOLIN  HFA Inhale 1-2 puffs into the lungs every 6 (six) hours as needed for wheezing or shortness of breath.   aspirin-acetaminophen-caffeine 250-250-65 MG tablet Commonly known as: EXCEDRIN MIGRAINE Take 2 tablets by mouth every 6 (six) hours as needed for headache.   doxycycline 100 MG capsule Commonly known as: VIBRAMYCIN Take 1 capsule (100 mg total) by mouth 2 (two) times daily. One po bid x 7 days   fluticasone 50 MCG/ACT nasal spray Commonly known as: FLONASE Place 2 sprays into both nostrils daily. What changed:   when to take this  reasons to take this   HYDROcodone-acetaminophen 5-325 MG tablet Commonly known as: NORCO/VICODIN Take 1 tablet by mouth every 4 (four) hours as needed.        ROS:  A comprehensive review of systems was negative except for: Genitourinary: positive for swelling and drainage of perineal region  Blood pressure 131/89, pulse 84, temperature 97.8 F (36.6 C), temperature source Other (Comment), resp. rate 16, height 6' (1.829 m), weight 292 lb (132.5 kg), SpO2 93 %. Physical Exam Vitals reviewed.  HENT:     Head: Normocephalic.     Mouth/Throat:     Mouth: Mucous membranes are moist.  Eyes:     Pupils: Pupils are equal,  round, and reactive to light.  Cardiovascular:     Rate and Rhythm: Normal rate.  Pulmonary:     Effort: Pulmonary effort is normal.     Breath sounds: Normal breath sounds.  Abdominal:     General: There is no distension.     Palpations: Abdomen is soft.     Tenderness: There is no abdominal tenderness.  Genitourinary:    Comments: Perineum with induration and tenderness, minimal erythema, no active drainage expressed, no tracking to the anal region Musculoskeletal:        General: Normal range of motion.     Cervical back: Normal range of motion.  Skin:    General: Skin is warm.  Neurological:     General: No focal deficit present.     Mental Status: He is alert and oriented to person, place, and time.   Psychiatric:        Mood and Affect: Mood normal.        Behavior: Behavior normal.        Thought Content: Thought content normal.        Judgment: Judgment normal.     Results: CT pelvis reviewed personally- abscess in the perineum superficial  CLINICAL DATA:  Perineal abscess.  EXAM: CT ABDOMEN AND PELVIS WITH CONTRAST  TECHNIQUE: Multidetector CT imaging of the abdomen and pelvis was performed using the standard protocol following bolus administration of intravenous contrast.  CONTRAST:  OMNIPAQUE IOHEXOL 300 MG/ML  SOLN  COMPARISON:  None.  FINDINGS: Lower chest: Unremarkable.  Hepatobiliary: No suspicious focal abnormality within the liver parenchyma. Tiny hypodensity in the lateral hepatic dome is too small to characterize at 6 mm but likely benign. Gallbladder is nondistended. No intrahepatic or extrahepatic biliary dilation.  Pancreas: No focal mass lesion. No dilatation of the main duct. No intraparenchymal cyst. No peripancreatic edema.  Spleen: No splenomegaly. No focal mass lesion.  Adrenals/Urinary Tract: No adrenal nodule or mass. 17 mm water density lesion posterior lower pole right kidney is compatible with a cyst. Similar 17 mm water density lesion in the upper pole left kidney is most suggestive of a cyst. No evidence for hydroureter. The urinary bladder appears normal for the degree of distention.  Stomach/Bowel: Stomach is unremarkable. No gastric wall thickening. No evidence of outlet obstruction. Duodenum is normally positioned as is the ligament of Treitz. No small bowel wall thickening. No small bowel dilatation. The terminal ileum is normal. The appendix is best seen on coronal images and is unremarkable. No gross colonic mass. No colonic wall thickening.  Vascular/Lymphatic: No abdominal aortic aneurysm. No abdominal aortic atherosclerotic calcification. There is no gastrohepatic or hepatoduodenal ligament  lymphadenopathy. No retroperitoneal or mesenteric lymphadenopathy. No pelvic sidewall lymphadenopathy. Upper normal to mildly enlarged groin nodes are evident bilaterally, measuring up to 16 mm short axis on the left (image 96/series 2).  Reproductive: The prostate gland and seminal vesicles are unremarkable.  Other: No intraperitoneal free fluid.  Musculoskeletal: 1.9 x 1.6 x 2.0 cm rim enhancing fluid collection is identified in the superficial subcutaneous fat, just deep to the skin of the left paramidline perineum. No worrisome lytic or sclerotic osseous abnormality.  IMPRESSION: 1. 1.9 x 1.6 x 2.0 cm rim enhancing fluid collection in the superficial subcutaneous fat, just deep to the skin of the left paramidline perineum. Imaging features compatible with abscess. No associated soft tissue gas. 2. Upper normal to mildly enlarged groin nodes bilaterally, likely reactive. Follow-up CT in 3 months could be used to  ensure resolution as clinically warranted. 3. Bilateral renal cysts.   Electronically Signed   By: Kennith Center M.D.   On: 09/23/2020 07:00  Assessment & Plan:  Marlene Pfluger is a 44 y.o. 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   Discussed excision and potential for improved healing and ability to close the wound if we can get a few weeks out from this acute process and infection. Discussed if we had to do something sooner he is more likely to require packing of the area.   Take antibiotic as prescribed. Refill sent in pending need. Continue soaks/ baths.  Keep area clean.  Call with questions or concerns.   Future Appointments  Date Time Provider Department Center  11/12/2020 10:45 AM Lucretia Roers, MD [facility] None     All questions were answered to the satisfaction of the patient.    Lucretia Roers 10/24/2020,  4:45 PM

## 2020-10-22 NOTE — Patient Instructions (Signed)
Take antibiotic as prescribed. Refill sent in pending need. Continue soaks/ baths.  Keep area clean.  Call with questions or concerns.

## 2020-10-24 ENCOUNTER — Encounter: Payer: Self-pay | Admitting: General Surgery

## 2020-11-05 ENCOUNTER — Other Ambulatory Visit: Payer: Self-pay

## 2020-11-05 ENCOUNTER — Encounter: Payer: Self-pay | Admitting: General Surgery

## 2020-11-05 ENCOUNTER — Ambulatory Visit (INDEPENDENT_AMBULATORY_CARE_PROVIDER_SITE_OTHER): Payer: Self-pay | Admitting: General Surgery

## 2020-11-05 VITALS — BP 135/88 | HR 84 | Temp 98.4°F | Resp 18 | Ht 72.0 in | Wt 291.0 lb

## 2020-11-05 DIAGNOSIS — L02215 Cutaneous abscess of perineum: Secondary | ICD-10-CM

## 2020-11-05 MED ORDER — DOXYCYCLINE HYCLATE 100 MG PO CAPS: 100.0000 mg | ORAL_CAPSULE | Freq: Two times a day (BID) | ORAL | 0 refills | Status: DC

## 2020-11-05 NOTE — Patient Instructions (Signed)
   Epidermal Cyst Removal  Epidermal cyst removal is a procedure to remove a sac of oily material (keratin) that has formed under your skin (epidermal cyst). Epidermal cysts may also be called epidermoid cysts, sebaceous cysts, or keratin cysts. Normally, the skin secretes this oily material through a gland or a hair follicle. However, when a skin gland or hair follicle becomes blocked, an epidermal cyst can form. You may need this procedure if you have an epidermal cyst that becomes large, uncomfortable, or infected. Tell a health care provider about:  Any allergies you have.  All medicines you are taking, including vitamins, herbs, eye drops, creams, and over-the-counter medicines.  Any problems you or family members have had with anesthetic medicines.  Any blood disorders you have.  Any surgeries you have had.  Any medical conditions you have now or have had.  Whether you are pregnant or may be pregnant. What are the risks? Generally, this is a safe procedure. However, problems may occur, including:  Development of another cyst.  Bleeding.  Infection.  Scarring. What happens before the procedure?  Ask your health care provider about: ? Changing or stopping your regular medicines. This is especially important if you are taking diabetes medicines or blood thinners. ? Taking medicines such as aspirin and ibuprofen. These medicines can thin your blood. Do not take these medicines unless your health care provider tells you to take them. ? Taking over-the-counter medicines, vitamins, herbs, and supplements.  If you have an infected cyst, you may have to take antibiotic medicine before the cyst removal. Take your antibiotic as told by your health care provider. Do not stop taking the antibiotic even if you start to feel better.  Take a shower on the morning of your procedure. Your health care provider may ask you to use a germ-killing soap. What happens during the  procedure?   You will be given a medicine to numb the area (local anesthetic).  The skin around the cyst will be cleaned with a germ-killing solution.  The health care provider will make a small incision in your skin over the cyst.  The health care provider will separate the cyst from the surrounding tissues that are under your skin.  If possible, the cyst will be removed undamaged (intact).  If the cyst bursts (ruptures), it will be removed in pieces.  After the cyst is removed, the health care provider will control any bleeding and close the incision with small stitches (sutures). Small incisions may not need sutures, and the bleeding will be controlled by applying direct pressure with gauze.  The health care provider may apply antibiotic ointment and a bandage (dressing) over the incision. The procedure may vary among health care providers and hospitals. What happens after the procedure?  If your cyst ruptured during the procedure, you may need an antibiotic. If you are prescribed an antibiotic medicine or ointment, take or apply it as told by your health care provider. Do not stop using the antibiotic even if you start to feel better. Summary  Epidermal cyst removal is a procedure to remove a sac of oily material (keratin) that has formed under your skin (epidermal cyst).  You may need this procedure if you have an epidermal cyst that becomes large, uncomfortable, or infected.  The health care provider will make a small incision in your skin to remove the cyst.  If you are prescribed an antibiotic medicine before the procedure, after the procedure, or both, use the antibiotic as   told by your health care provider. Do not stop using the antibiotic even if you start to feel better. This information is not intended to replace advice given to you by your health care provider. Make sure you discuss any questions you have with your health care provider. Document Revised: 03/30/2019  Document Reviewed: 09/30/2017 Elsevier Patient Education  2020 [employer]  

## 2020-11-05 NOTE — Progress Notes (Signed)
[facility] History and Physical  Reason for Referral: Infected perineal cyst / abscess    James Adams is a 44 y.o. male.  HPI: Mr. Budlong is a 44 yo who I saw a few weeks back after he has had about 9 recurrences of a perineal abscess over the last 6 years. He had an I&D in the ED the weeks prior and antibiotics but continues to have flares of this area despite these measures. I had given him a refill on antibiotics pending this not resolving, and he has had to fill this. We discussed that excision at this state with infection would potentially require packing given the infection. He is ready to go ahead with the excision due to the increased swelling and discomfort.  He has had no abscesses in the perianal region or evidence of fistula.   Past Medical History:  Diagnosis Date  . Asthma     History reviewed. No pertinent surgical history.  Family History  Problem Relation Age of Onset  . Diabetes Mother   . Diabetes Father     Social History   Tobacco Use  . Smoking status: Former Smoker    Packs/day: 1.50    Years: 22.00    Pack years: 33.00    Types: Cigarettes    Quit date: 12/21/2017    Years since quitting: 2.8  . Smokeless tobacco: Never Used  Vaping Use  . Vaping Use: Every day  . Substances: Nicotine, Flavoring  Substance Use Topics  . Alcohol use: Not Currently    Comment: occ  . Drug use: Never    Medications: I have reviewed the patient's current medications. Allergies as of 11/05/2020   No Known Allergies     Medication List       Accurate as of November 05, 2020  2:39 PM. If you have any questions, ask your nurse or doctor.        albuterol 108 (90 Base) MCG/ACT inhaler Commonly known as: VENTOLIN HFA Inhale 1-2 puffs into the lungs every 6 (six) hours as needed for wheezing or shortness of breath.   aspirin-acetaminophen-caffeine 250-250-65 MG tablet Commonly known as: EXCEDRIN MIGRAINE Take 2 tablets by mouth every 6 (six) hours  as needed for headache.   doxycycline 100 MG capsule Commonly known as: VIBRAMYCIN Take 1 capsule (100 mg total) by mouth 2 (two) times daily. One po bid x 7 days   fluticasone 50 MCG/ACT nasal spray Commonly known as: FLONASE Place 2 sprays into both nostrils daily. What changed:   when to take this  reasons to take this   HYDROcodone-acetaminophen 5-325 MG tablet Commonly known as: NORCO/VICODIN Take 1 tablet by mouth every 4 (four) hours as needed.        ROS:  A comprehensive review of systems was negative except for: Genitourinary: positive for swelling and drainage of perineum  Blood pressure 135/88, pulse 84, temperature 98.4 F (36.9 C), temperature source Oral, resp. rate 18, height 6' (1.829 m), weight 291 lb (132 kg), SpO2 92 %. Physical Exam Vitals reviewed.  Constitutional:      Appearance: Normal appearance.  HENT:     Head: Normocephalic.     Nose: Nose normal.  Eyes:     Extraocular Movements: Extraocular movements intact.     Pupils: Pupils are equal, round, and reactive to light.  Cardiovascular:     Rate and Rhythm: Normal rate and regular rhythm.  Pulmonary:     Effort: Pulmonary effort is normal.  Breath sounds: Normal breath sounds.  Abdominal:     General: There is no distension.     Palpations: Abdomen is soft.     Tenderness: There is no abdominal tenderness.  Genitourinary:    Comments: Midline perineum with induration, swelling, tenderness, no expressed drainage or fluctuance  Musculoskeletal:        General: Normal range of motion.     Cervical back: Normal range of motion.  Skin:    General: Skin is warm.  Neurological:     General: No focal deficit present.     Mental Status: He is alert and oriented to person, place, and time.  Psychiatric:        Mood and Affect: Mood normal.        Behavior: Behavior normal.        Thought Content: Thought content normal.        Judgment: Judgment normal.     Results: Personally  reviewed CT - superficial area in perineal region consistent with infected abscess cavity /cyst   Narrative & Impression  CLINICAL DATA:  Perineal abscess.  EXAM: CT ABDOMEN AND PELVIS WITH CONTRAST  TECHNIQUE: Multidetector CT imaging of the abdomen and pelvis was performed using the standard protocol following bolus administration of intravenous contrast.  CONTRAST:  OMNIPAQUE IOHEXOL 300 MG/ML  SOLN  COMPARISON:  None.  FINDINGS: Lower chest: Unremarkable.  Hepatobiliary: No suspicious focal abnormality within the liver parenchyma. Tiny hypodensity in the lateral hepatic dome is too small to characterize at 6 mm but likely benign. Gallbladder is nondistended. No intrahepatic or extrahepatic biliary dilation.  Pancreas: No focal mass lesion. No dilatation of the main duct. No intraparenchymal cyst. No peripancreatic edema.  Spleen: No splenomegaly. No focal mass lesion.  Adrenals/Urinary Tract: No adrenal nodule or mass. 17 mm water density lesion posterior lower pole right kidney is compatible with a cyst. Similar 17 mm water density lesion in the upper pole left kidney is most suggestive of a cyst. No evidence for hydroureter. The urinary bladder appears normal for the degree of distention.  Stomach/Bowel: Stomach is unremarkable. No gastric wall thickening. No evidence of outlet obstruction. Duodenum is normally positioned as is the ligament of Treitz. No small bowel wall thickening. No small bowel dilatation. The terminal ileum is normal. The appendix is best seen on coronal images and is unremarkable. No gross colonic mass. No colonic wall thickening.  Vascular/Lymphatic: No abdominal aortic aneurysm. No abdominal aortic atherosclerotic calcification. There is no gastrohepatic or hepatoduodenal ligament lymphadenopathy. No retroperitoneal or mesenteric lymphadenopathy. No pelvic sidewall lymphadenopathy. Upper normal to mildly enlarged groin  nodes are evident bilaterally, measuring up to 16 mm short axis on the left (image 96/series 2).  Reproductive: The prostate gland and seminal vesicles are unremarkable.  Other: No intraperitoneal free fluid.  Musculoskeletal: 1.9 x 1.6 x 2.0 cm rim enhancing fluid collection is identified in the superficial subcutaneous fat, just deep to the skin of the left paramidline perineum. No worrisome lytic or sclerotic osseous abnormality.  IMPRESSION: 1. 1.9 x 1.6 x 2.0 cm rim enhancing fluid collection in the superficial subcutaneous fat, just deep to the skin of the left paramidline perineum. Imaging features compatible with abscess. No associated soft tissue gas. 2. Upper normal to mildly enlarged groin nodes bilaterally, likely reactive. Follow-up CT in 3 months could be used to ensure resolution as clinically warranted. 3. Bilateral renal cysts.   Electronically Signed   By: Kennith Center M.D.   On: 09/23/2020 07:00  Assessment & Plan:  Elmo Sorrels is a 44 y.o. male with an infected perineal cyst versus abscess. He has no prior evidence of any perianal disease.   -Excision of perineal cyst/ infection, exam under anesthesia  -Discussed risk of bleeding, infection, recurrence, possibility of needing a drain or finding something that is a fistula track from the anus  -Discussed preop COVID testing  -Refilled Doxycyline for 7 more days to help reduce infection burden prior to excision   All questions were answered to the satisfaction of the patient.    Ellice Boultinghouse C Joey Lierman 11/05/2020, 2:39 PM      

## 2020-11-06 NOTE — H&P (Signed)
[facility] History and Physical  Reason for Referral: Infected perineal cyst / abscess    James Adams is a 44 y.o. male.  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 James Adams had an I&D in the ED the weeks prior and antibiotics but continues to have flares of this area despite these measures. I had given him a refill on antibiotics pending this not resolving, and James Adams has had to fill this. We discussed that excision at this state with infection would potentially require packing given the infection. James Adams is ready to go ahead with the excision due to the increased swelling and discomfort.  James Adams has had no abscesses in the perianal region or evidence of fistula.   Past Medical History:  Diagnosis Date  . Asthma     History reviewed. No pertinent surgical history.  Family History  Problem Relation Age of Onset  . Diabetes Mother   . Diabetes Father     Social History   Tobacco Use  . Smoking status: Former Smoker    Packs/day: 1.50    Years: 22.00    Pack years: 33.00    Types: Cigarettes    Quit date: 12/21/2017    Years since quitting: 2.8  . Smokeless tobacco: Never Used  Vaping Use  . Vaping Use: Every day  . Substances: Nicotine, Flavoring  Substance Use Topics  . Alcohol use: Not Currently    Comment: occ  . Drug use: Never    Medications: I have reviewed the patient's current medications. Allergies as of 11/05/2020   No Known Allergies     Medication List       Accurate as of November 05, 2020  2:39 PM. If you have any questions, ask your nurse or doctor.        albuterol 108 (90 Base) MCG/ACT inhaler Commonly known as: VENTOLIN HFA Inhale 1-2 puffs into the lungs every 6 (six) hours as needed for wheezing or shortness of breath.   aspirin-acetaminophen-caffeine 250-250-65 MG tablet Commonly known as: EXCEDRIN MIGRAINE Take 2 tablets by mouth every 6 (six) hours  as needed for headache.   doxycycline 100 MG capsule Commonly known as: VIBRAMYCIN Take 1 capsule (100 mg total) by mouth 2 (two) times daily. One po bid x 7 days   fluticasone 50 MCG/ACT nasal spray Commonly known as: FLONASE Place 2 sprays into both nostrils daily. What changed:   when to take this  reasons to take this   HYDROcodone-acetaminophen 5-325 MG tablet Commonly known as: NORCO/VICODIN Take 1 tablet by mouth every 4 (four) hours as needed.        ROS:  A comprehensive review of systems was negative except for: Genitourinary: positive for swelling and drainage of perineum  Blood pressure 135/88, pulse 84, temperature 98.4 F (36.9 C), temperature source Oral, resp. rate 18, height 6' (1.829 m), weight 291 lb (132 kg), SpO2 92 %. Physical Exam Vitals reviewed.  Constitutional:      Appearance: Normal appearance.  HENT:     Head: Normocephalic.     Nose: Nose normal.  Eyes:     Extraocular Movements: Extraocular movements intact.     Pupils: Pupils are equal, round, and reactive to light.  Cardiovascular:     Rate and Rhythm: Normal rate and regular rhythm.  Pulmonary:     Effort: Pulmonary effort is normal.  Breath sounds: Normal breath sounds.  Abdominal:     General: There is no distension.     Palpations: Abdomen is soft.     Tenderness: There is no abdominal tenderness.  Genitourinary:    Comments: Midline perineum with induration, swelling, tenderness, no expressed drainage or fluctuance  Musculoskeletal:        General: Normal range of motion.     Cervical back: Normal range of motion.  Skin:    General: Skin is warm.  Neurological:     General: No focal deficit present.     Mental Status: James Adams is alert and oriented to person, place, and time.  Psychiatric:        Mood and Affect: Mood normal.        Behavior: Behavior normal.        Thought Content: Thought content normal.        Judgment: Judgment normal.     Results: Personally  reviewed CT - superficial area in perineal region consistent with infected abscess cavity /cyst   Narrative & Impression  CLINICAL DATA:  Perineal abscess.  EXAM: CT ABDOMEN AND PELVIS WITH CONTRAST  TECHNIQUE: Multidetector CT imaging of the abdomen and pelvis was performed using the standard protocol following bolus administration of intravenous contrast.  CONTRAST:  OMNIPAQUE IOHEXOL 300 MG/ML  SOLN  COMPARISON:  None.  FINDINGS: Lower chest: Unremarkable.  Hepatobiliary: No suspicious focal abnormality within the liver parenchyma. Tiny hypodensity in the lateral hepatic dome is too small to characterize at 6 mm but likely benign. Gallbladder is nondistended. No intrahepatic or extrahepatic biliary dilation.  Pancreas: No focal mass lesion. No dilatation of the main duct. No intraparenchymal cyst. No peripancreatic edema.  Spleen: No splenomegaly. No focal mass lesion.  Adrenals/Urinary Tract: No adrenal nodule or mass. 17 mm water density lesion posterior lower pole right kidney is compatible with a cyst. Similar 17 mm water density lesion in the upper pole left kidney is most suggestive of a cyst. No evidence for hydroureter. The urinary bladder appears normal for the degree of distention.  Stomach/Bowel: Stomach is unremarkable. No gastric wall thickening. No evidence of outlet obstruction. Duodenum is normally positioned as is the ligament of Treitz. No small bowel wall thickening. No small bowel dilatation. The terminal ileum is normal. The appendix is best seen on coronal images and is unremarkable. No gross colonic mass. No colonic wall thickening.  Vascular/Lymphatic: No abdominal aortic aneurysm. No abdominal aortic atherosclerotic calcification. There is no gastrohepatic or hepatoduodenal ligament lymphadenopathy. No retroperitoneal or mesenteric lymphadenopathy. No pelvic sidewall lymphadenopathy. Upper normal to mildly enlarged groin  nodes are evident bilaterally, measuring up to 16 mm short axis on the left (image 96/series 2).  Reproductive: The prostate gland and seminal vesicles are unremarkable.  Other: No intraperitoneal free fluid.  Musculoskeletal: 1.9 x 1.6 x 2.0 cm rim enhancing fluid collection is identified in the superficial subcutaneous fat, just deep to the skin of the left paramidline perineum. No worrisome lytic or sclerotic osseous abnormality.  IMPRESSION: 1. 1.9 x 1.6 x 2.0 cm rim enhancing fluid collection in the superficial subcutaneous fat, just deep to the skin of the left paramidline perineum. Imaging features compatible with abscess. No associated soft tissue gas. 2. Upper normal to mildly enlarged groin nodes bilaterally, likely reactive. Follow-up CT in 3 months could be used to ensure resolution as clinically warranted. 3. Bilateral renal cysts.   Electronically Signed   By: Kennith Center M.D.   On: 09/23/2020 07:00  Assessment & Plan:  James Adams is a 44 y.o. male with an infected perineal cyst versus abscess. James Adams has no prior evidence of any perianal disease.   -Excision of perineal cyst/ infection, exam under anesthesia  -Discussed risk of bleeding, infection, recurrence, possibility of needing a drain or finding something that is a fistula track from the anus  -Discussed preop COVID testing  -Refilled Doxycyline for 7 more days to help reduce infection burden prior to excision   All questions were answered to the satisfaction of the patient.    Lucretia Roers 11/05/2020, 2:39 PM

## 2020-11-07 ENCOUNTER — Other Ambulatory Visit: Payer: Self-pay

## 2020-11-07 ENCOUNTER — Other Ambulatory Visit (HOSPITAL_COMMUNITY)
Admission: RE | Admit: 2020-11-07 | Discharge: 2020-11-07 | Disposition: A | Discharge status: Home / Self Care | Payer: Self-pay | Source: Ambulatory Visit | Attending: General Surgery | Admitting: General Surgery

## 2020-11-07 ENCOUNTER — Encounter (HOSPITAL_COMMUNITY): Payer: Self-pay

## 2020-11-07 ENCOUNTER — Encounter (HOSPITAL_COMMUNITY)
Admission: RE | Admit: 2020-11-07 | Discharge: 2020-11-07 | Disposition: A | Discharge status: Home / Self Care | Payer: Self-pay | Source: Ambulatory Visit | Attending: General Surgery | Admitting: General Surgery

## 2020-11-07 DIAGNOSIS — Z01812 Encounter for preprocedural laboratory examination: Secondary | ICD-10-CM | POA: Insufficient documentation

## 2020-11-07 DIAGNOSIS — Z01818 Encounter for other preprocedural examination: Secondary | ICD-10-CM | POA: Insufficient documentation

## 2020-11-07 DIAGNOSIS — Z20822 Contact with and (suspected) exposure to covid-19: Secondary | ICD-10-CM | POA: Insufficient documentation

## 2020-11-07 NOTE — Patient Instructions (Signed)
James Adams  11/07/2020     @PREFPERIOPPHARMACY @   Your procedure is scheduled on 11/11/2020.  Report to 11/13/2020 at 11:30 A.M.  Call this number if you have problems the morning of surgery:  [phone]   Remember:  Do not eat or drink after midnight.     Take these medicines the morning of surgery with A SIP OF WATER : Hydrocodone    Do not wear jewelry, make-up or nail polish.  Do not wear lotions, powders, or perfumes, or deodorant.  Do not shave 48 hours prior to surgery.  Men may shave face and neck.  Pilonidal Cyst Drainage, Care After This sheet gives you information about how to care for yourself after your procedure. Your health care provider may also give you more specific instructions. If you have problems or questions, contact your health care provider. What can I expect after the procedure? After the procedure, it is common to have:  Pain that gets better when you take medicine.  Some fluid or blood coming from your wound. Follow these instructions at home: Medicines  Take over-the-counter and prescription medicines only as told by your health care provider.  If you were prescribed an antibiotic medicine, take it as told by your health care provider. Do not stop taking the antibiotic even if you start to feel better. Lifestyle  Do not do activities that irritate or put pressure on your buttocks for about 2 weeks, or as long as told by your health care provider. These activities include bike riding, running, and anything that involves a twisting motion.  Do not sit for long periods at a time without getting up to move around.  Sleep on your side instead of your back.  Avoid wearing tight underwear and tight pants. Bathing  Do not take baths or showers, swim, or use a hot tub until your health care provider approves. This depends on the type of wound you have from surgery.  While bathing, clean your buttocks area gently with soap and water.  After  bathing: ? Pat the area dry with a soft, clean towel. ? Cover the area with a clean bandage (dressing), if told to by your health care provider. General instructions   If you are taking prescription pain medicine, take actions to prevent or treat constipation. Your health care provider may recommend that you: ? Drink enough fluid to keep your urine pale yellow. ? Eat foods that are high in fiber, such as fresh fruits and vegetables, whole grains, and beans. ? Limit foods that are high in fat and processed sugars, such as fried or sweet foods. ? Take an over-the-counter or prescription medicine for constipation.  You will need to have a caregiver help you manage wound care and dressing changes. Your caregiver should: ? Wash his or her hands with soap and water before changing your dressing. If soap and water are not available, your caregiver should use hand sanitizer. ? Check your wound every day for signs of infection, such as:  Redness, swelling, or more pain.  More fluid or blood.  Warmth.  Pus or a bad smell. ? Follow any additional instructions from your health care provider on how to care for your wound, such as wound cleaning, wound flushing (irrigation), or packing your wound with a dressing.  Keep all follow-up visits as told by your health care provider. This is important. If you had incision and drainage with wound packing:  Return to your health care provider as  instructed to have your packing material changed or removed.  Keep the area dry until your packing has been removed.  After the packing has been removed, you may start taking showers. If you had marsupialization:  You may start taking showers the day after surgery, or when your health care provider approves.  Remove your dressing before you shower, but let the water from the shower moisten your dressing before you remove it. This will make it easier to remove.  Ask your health care provider when you can stop  using a dressing. If you had incision and drainage without wound packing:  Change your dressing as directed.  Leave stitches (sutures), skin glue, or adhesive strips in place. These skin closures may need to stay in place for 2 weeks or longer. If adhesive strip edges start to loosen and curl up, you may trim the loose edges. Do not remove adhesive strips completely unless your health care provider tells you to do that. Contact a health care provider if:  You have redness, swelling, or more pain around your wound.  You have more fluid or blood coming from your wound.  You have new bleeding from your wound.  Your wound feels warm to the touch.  There is pus or a bad smell coming from your wound.  You have pain that does not get better with medicine.  You have a fever or chills.  You have muscle aches.  You are dizzy.  You feel generally sick. Summary  After a procedure to drain a pilonidal cyst, it is common to have some fluid or blood coming from your wound.  If you were prescribed an antibiotic medicine, take it as told by your health care provider. Do not stop taking the antibiotic even if you start to feel better.  Return to your health care provider as instructed to have any packing material changed or removed. This information is not intended to replace advice given to you by your health care provider. Make sure you discuss any questions you have with your health care provider. Document Revised: 09/29/2018 Document Reviewed: 11/29/2017 Elsevier Patient Education  2020 [employer].  Do not bring valuables to the hospital.  [facility] is not responsible for any belongings or valuables.  Contacts, dentures or bridgework may not be worn into surgery.  Leave your suitcase in the car.  After surgery it may be brought to your room.  For patients admitted to the hospital, discharge time will be determined by your treatment team.  Patients discharged the day of surgery will  not be allowed to drive home.   Name and phone number of your driver:   family Special instructions:  n/a

## 2020-11-08 ENCOUNTER — Other Ambulatory Visit (HOSPITAL_COMMUNITY)
Admission: RE | Admit: 2020-11-08 | Discharge: 2020-11-08 | Disposition: A | Discharge status: Home / Self Care | Payer: Self-pay | Source: Ambulatory Visit | Attending: General Surgery | Admitting: General Surgery

## 2020-11-08 DIAGNOSIS — Z01812 Encounter for preprocedural laboratory examination: Secondary | ICD-10-CM | POA: Insufficient documentation

## 2020-11-08 DIAGNOSIS — Z20822 Contact with and (suspected) exposure to covid-19: Secondary | ICD-10-CM | POA: Insufficient documentation

## 2020-11-08 LAB — SARS CORONAVIRUS 2 (TAT 6-24 HRS): SARS Coronavirus 2: NEGATIVE

## 2020-11-11 ENCOUNTER — Encounter (HOSPITAL_COMMUNITY): Payer: Self-pay | Admitting: General Surgery

## 2020-11-11 ENCOUNTER — Ambulatory Visit (HOSPITAL_COMMUNITY): Payer: Self-pay | Admitting: Anesthesiology

## 2020-11-11 ENCOUNTER — Ambulatory Visit (HOSPITAL_COMMUNITY)
Admission: RE | Admit: 2020-11-11 | Discharge: 2020-11-11 | Disposition: A | Discharge status: Home / Self Care | Payer: Self-pay | Attending: General Surgery | Admitting: General Surgery

## 2020-11-11 ENCOUNTER — Encounter (HOSPITAL_COMMUNITY)
Admission: RE | Disposition: A | Discharge status: Home / Self Care | Payer: Self-pay | Source: Home / Self Care | Attending: General Surgery

## 2020-11-11 ENCOUNTER — Other Ambulatory Visit: Payer: Self-pay

## 2020-11-11 DIAGNOSIS — Z87891 Personal history of nicotine dependence: Secondary | ICD-10-CM | POA: Insufficient documentation

## 2020-11-11 DIAGNOSIS — L089 Local infection of the skin and subcutaneous tissue, unspecified: Secondary | ICD-10-CM

## 2020-11-11 DIAGNOSIS — L02215 Cutaneous abscess of perineum: Secondary | ICD-10-CM

## 2020-11-11 DIAGNOSIS — L723 Sebaceous cyst: Secondary | ICD-10-CM

## 2020-11-11 DIAGNOSIS — K6289 Other specified diseases of anus and rectum: Secondary | ICD-10-CM | POA: Insufficient documentation

## 2020-11-11 HISTORY — PX: MASS EXCISION: SHX2000

## 2020-11-11 SURGERY — EXCISION MASS
Anesthesia: General | Site: Perineum

## 2020-11-11 MED ORDER — PROPOFOL 10 MG/ML IV BOLUS
INTRAVENOUS | Status: DC | PRN
  Administered 2020-11-11: 200 mg via INTRAVENOUS

## 2020-11-11 MED ORDER — PROMETHAZINE HCL 25 MG/ML IJ SOLN: 6.2500 mg | INTRAMUSCULAR | Status: DC | PRN

## 2020-11-11 MED ORDER — LACTATED RINGERS IV SOLN: INTRAVENOUS | Status: DC | PRN

## 2020-11-11 MED ORDER — BACITRACIN-NEOMYCIN-POLYMYXIN 400-5-5000 EX OINT
TOPICAL_OINTMENT | CUTANEOUS | Status: AC
  Filled 2020-11-11: qty 1

## 2020-11-11 MED ORDER — MEPERIDINE HCL 50 MG/ML IJ SOLN: 6.2500 mg | INTRAMUSCULAR | Status: DC | PRN

## 2020-11-11 MED ORDER — CEFAZOLIN SODIUM-DEXTROSE 2-4 GM/100ML-% IV SOLN
2.0000 g | INTRAVENOUS | Status: AC
  Administered 2020-11-11: 2 g via INTRAVENOUS
  Filled 2020-11-11: qty 100

## 2020-11-11 MED ORDER — PROPOFOL 10 MG/ML IV BOLUS
INTRAVENOUS | Status: AC
  Filled 2020-11-11: qty 20

## 2020-11-11 MED ORDER — BUPIVACAINE HCL (PF) 0.5 % IJ SOLN
INTRAMUSCULAR | Status: DC | PRN
  Administered 2020-11-11: 10 mL

## 2020-11-11 MED ORDER — MIDAZOLAM HCL 2 MG/2ML IJ SOLN
INTRAMUSCULAR | Status: AC
  Filled 2020-11-11: qty 2

## 2020-11-11 MED ORDER — ONDANSETRON HCL 4 MG PO TABS: 4.0000 mg | ORAL_TABLET | Freq: Three times a day (TID) | ORAL | 1 refills | Status: DC | PRN

## 2020-11-11 MED ORDER — FENTANYL CITRATE (PF) 100 MCG/2ML IJ SOLN
INTRAMUSCULAR | Status: AC
  Filled 2020-11-11: qty 2

## 2020-11-11 MED ORDER — CEFAZOLIN SODIUM-DEXTROSE 2-4 GM/100ML-% IV SOLN: 2.0000 g | INTRAVENOUS | Status: DC

## 2020-11-11 MED ORDER — OXYCODONE HCL 5 MG PO TABS
5.0000 mg | ORAL_TABLET | ORAL | 0 refills | Status: DC | PRN
Start: 2020-11-11 — End: 2020-11-19

## 2020-11-11 MED ORDER — GLYCOPYRROLATE PF 0.2 MG/ML IJ SOSY
PREFILLED_SYRINGE | INTRAMUSCULAR | Status: DC | PRN
  Administered 2020-11-11: .2 mg via INTRAVENOUS

## 2020-11-11 MED ORDER — LACTATED RINGERS IV SOLN: Freq: Once | INTRAVENOUS | Status: AC

## 2020-11-11 MED ORDER — MIDAZOLAM HCL 5 MG/5ML IJ SOLN
INTRAMUSCULAR | Status: DC | PRN
  Administered 2020-11-11: 2 mg via INTRAVENOUS

## 2020-11-11 MED ORDER — FENTANYL CITRATE (PF) 100 MCG/2ML IJ SOLN
INTRAMUSCULAR | Status: DC | PRN
  Administered 2020-11-11 (×4): 50 ug via INTRAVENOUS

## 2020-11-11 MED ORDER — BUPIVACAINE HCL (PF) 0.5 % IJ SOLN
INTRAMUSCULAR | Status: AC
  Filled 2020-11-11: qty 30

## 2020-11-11 MED ORDER — LIDOCAINE HCL (PF) 2 % IJ SOLN
INTRAMUSCULAR | Status: AC
  Filled 2020-11-11: qty 5

## 2020-11-11 MED ORDER — ONDANSETRON HCL 4 MG/2ML IJ SOLN
INTRAMUSCULAR | Status: AC
  Filled 2020-11-11: qty 2

## 2020-11-11 MED ORDER — CEFAZOLIN SODIUM-DEXTROSE 1-4 GM/50ML-% IV SOLN
1.0000 g | Freq: Once | INTRAVENOUS | Status: AC
  Administered 2020-11-11: 1 g via INTRAVENOUS
  Filled 2020-11-11: qty 50

## 2020-11-11 MED ORDER — CHLORHEXIDINE GLUCONATE CLOTH 2 % EX PADS: 6.0000 | MEDICATED_PAD | Freq: Once | CUTANEOUS | Status: DC

## 2020-11-11 MED ORDER — DEXAMETHASONE SODIUM PHOSPHATE 10 MG/ML IJ SOLN
INTRAMUSCULAR | Status: AC
  Filled 2020-11-11: qty 1

## 2020-11-11 MED ORDER — HYDROMORPHONE HCL 1 MG/ML IJ SOLN: 0.2500 mg | INTRAMUSCULAR | Status: DC | PRN

## 2020-11-11 MED ORDER — ONDANSETRON HCL 4 MG/2ML IJ SOLN
INTRAMUSCULAR | Status: DC | PRN
  Administered 2020-11-11: 4 mg via INTRAVENOUS

## 2020-11-11 MED ORDER — ORAL CARE MOUTH RINSE: 15.0000 mL | Freq: Once | OROMUCOSAL | Status: AC

## 2020-11-11 MED ORDER — BACITRACIN-NEOMYCIN-POLYMYXIN 400-5-5000 EX OINT
TOPICAL_OINTMENT | CUTANEOUS | Status: DC | PRN
  Administered 2020-11-11: 1 via TOPICAL

## 2020-11-11 MED ORDER — CHLORHEXIDINE GLUCONATE 0.12 % MT SOLN
15.0000 mL | Freq: Once | OROMUCOSAL | Status: AC
  Administered 2020-11-11: 15 mL via OROMUCOSAL
  Filled 2020-11-11: qty 15

## 2020-11-11 MED ORDER — 0.9 % SODIUM CHLORIDE (POUR BTL) OPTIME
TOPICAL | Status: DC | PRN
  Administered 2020-11-11: 1000 mL

## 2020-11-11 MED ORDER — LIDOCAINE 2% (20 MG/ML) 5 ML SYRINGE
INTRAMUSCULAR | Status: DC | PRN
  Administered 2020-11-11: 100 mg via INTRAVENOUS

## 2020-11-11 MED ORDER — GLYCOPYRROLATE PF 0.2 MG/ML IJ SOSY
PREFILLED_SYRINGE | INTRAMUSCULAR | Status: AC
  Filled 2020-11-11: qty 1

## 2020-11-11 MED ORDER — DEXAMETHASONE SODIUM PHOSPHATE 4 MG/ML IJ SOLN
INTRAMUSCULAR | Status: DC | PRN
  Administered 2020-11-11: 5 mg via INTRAVENOUS

## 2020-11-11 SURGICAL SUPPLY — 31 items
APL PRP STRL LF ISPRP CHG 10.5 (MISCELLANEOUS) ×1
APPLICATOR CHLORAPREP 10.5 ORG (MISCELLANEOUS) ×3 IMPLANT
CLOTH BEACON ORANGE TIMEOUT ST (SAFETY) ×3 IMPLANT
COVER LIGHT HANDLE STERIS (MISCELLANEOUS) ×6 IMPLANT
COVER WAND RF STERILE (DRAPES) ×3 IMPLANT
DECANTER SPIKE VIAL GLASS SM (MISCELLANEOUS) ×3 IMPLANT
ELECT NEEDLE TIP 2.8 STRL (NEEDLE) IMPLANT
ELECT REM PT RETURN 9FT ADLT (ELECTROSURGICAL) ×3
ELECTRODE REM PT RTRN 9FT ADLT (ELECTROSURGICAL) ×1 IMPLANT
GLOVE BIO SURGEON STRL SZ 6.5 (GLOVE) ×2 IMPLANT
GLOVE BIO SURGEONS STRL SZ 6.5 (GLOVE) ×1
GLOVE BIOGEL PI IND STRL 6.5 (GLOVE) ×1 IMPLANT
GLOVE BIOGEL PI IND STRL 7.0 (GLOVE) ×1 IMPLANT
GLOVE BIOGEL PI INDICATOR 6.5 (GLOVE) ×2
GLOVE BIOGEL PI INDICATOR 7.0 (GLOVE) ×2
GLOVE ECLIPSE 6.5 STRL STRAW (GLOVE) ×3 IMPLANT
GOWN STRL REUS W/TWL LRG LVL3 (GOWN DISPOSABLE) ×6 IMPLANT
KIT TURNOVER KIT A (KITS) ×3 IMPLANT
MANIFOLD NEPTUNE II (INSTRUMENTS) ×3 IMPLANT
NEEDLE HYPO 25X1 1.5 SAFETY (NEEDLE) ×3 IMPLANT
NS IRRIG 1000ML POUR BTL (IV SOLUTION) ×3 IMPLANT
PACK MINOR (CUSTOM PROCEDURE TRAY) ×3 IMPLANT
PAD ARMBOARD 7.5X6 YLW CONV (MISCELLANEOUS) ×3 IMPLANT
SET BASIN LINEN APH (SET/KITS/TRAYS/PACK) ×3 IMPLANT
SHEET LAVH (DRAPES) ×3 IMPLANT
SUT ETHILON 3 0 FSL (SUTURE) ×3 IMPLANT
SUT MNCRL AB 4-0 PS2 18 (SUTURE) ×3 IMPLANT
SUT PROLENE 4 0 PS 2 18 (SUTURE) IMPLANT
SUT VIC AB 3-0 SH 27 (SUTURE) ×3
SUT VIC AB 3-0 SH 27X BRD (SUTURE) ×1 IMPLANT
SYR CONTROL 10ML LL (SYRINGE) ×3 IMPLANT

## 2020-11-11 NOTE — Anesthesia Procedure Notes (Signed)
Procedure Name: LMA Insertion Date/Time: 11/11/2020 1:14 PM Performed by: Mayer Camel, CRNA Pre-anesthesia Checklist: Patient identified, Emergency Drugs available, Suction available and Patient being monitored Patient Re-evaluated:Patient Re-evaluated prior to induction Oxygen Delivery Method: Circle System Utilized Preoxygenation: Pre-oxygenation with 100% oxygen Induction Type: IV induction Ventilation: Mask ventilation without difficulty LMA: LMA inserted LMA Size: 5.0 Number of attempts: 1 Airway Equipment and Method: Bite block Placement Confirmation: positive ETCO2 Tube secured with: Tape Dental Injury: Teeth and Oropharynx as per pre-operative assessment

## 2020-11-11 NOTE — Interval H&P Note (Signed)
History and Physical Interval Note:  11/11/2020 12:11 PM  James Adams  has presented today for surgery, with the diagnosis of infected perianal cyst.  The various methods of treatment have been discussed with the patient and family. After consideration of risks, benefits and other options for treatment, the patient has consented to  Procedure(s): EXCISION PERIANAL INFECTED CYST (N/A) as a surgical intervention.  The patient's history has been reviewed, patient examined, no change in status, stable for surgery.  I have reviewed the patient's chart and labs.  Questions were answered to the patient's satisfaction.    Feels like perineal area has decreased in size. Marked.  Lucretia Roers

## 2020-11-11 NOTE — Anesthesia Postprocedure Evaluation (Addendum)
Anesthesia Post Note  Patient: James Adams  Procedure(s) Performed: EXCISION PERINEAL INFECTED CYST (N/A Perineum)  Patient location during evaluation: PACU Anesthesia Type: General Level of consciousness: awake and alert and oriented Pain management: pain level controlled Vital Signs Assessment: post-procedure vital signs reviewed and stable Respiratory status: spontaneous breathing Cardiovascular status: blood pressure returned to baseline and stable Postop Assessment: no apparent nausea or vomiting Anesthetic complications: no   No complications documented.   Last Vitals:  Vitals:   11/11/20 1405 11/11/20 1415  BP: (!) 90/59 110/77  Pulse: 83 96  Resp: 11 15  Temp: 36.8 C   SpO2: 98% 95%    Last Pain:  Vitals:   11/11/20 1140  TempSrc: Oral  PainSc: 0-No pain                 Fajr Fife

## 2020-11-11 NOTE — Discharge Instructions (Signed)
Discharge Instructions:    Activity:  Many individuals are able to return to work and resume routine activities the 1-2 weeks after their procedure. After 1-2 weeks we will remove the sutures.   Care Instructions: You can resume your normal diet once you have sufficiently recovered from anesthesia. You should drink lots of liquid. Water is best. Try to drink at least 6-8 glasses of liquid daily.  Keep neosporin on the area twice daily and clean the area after bowel movements.  Do a Sitz bath after BMs to keep you clean.   Medications: It is very important to prevent constipation after surgery. You should take a stool softener, such as docusate sodium (Colace), twice a day as needed for constipation, especially if you are taking narcotics. If your stools become loose, you can stop taking the softeners.  Take tylenol and ibuprofen as needed for pain control, alternating every 4-6 hours.  Example:  Tylenol 1000mg  @ 6am, 12noon, 6pm, (Do not exceed 4000mg  of tylenol a day).  Ibuprofen 800mg  @ 9am, 3pm, 9pm, 3am (Do not exceed 3600mg  of ibuprofen a day).  Take Roxicodone for breakthrough pain every 4 hours.  Drink plenty of water to also prevent constipation.  Do not drive, operate machinery, or drink alcohol when taking narcotic pain medication.  Within a few days, your pain should be sufficiently controlled with medications such as ibuprofen or acetaminophen.  Dressing: You can replace your pad as needed after surgery. Feminine pads work best.  It is normal to see some bloody drainage on the dressing.  Bleeding should not be heavy or continuous.   Hygiene: Keep the surgical area clean by taking Sitz baths (or tub baths) several times per day. These are helpful for cleaning after each bowel movement. Frequent showering is an alternative to baths, although many patients find baths soothing after surgery. . Equipment for can be purchased at  or medical/surgical supply stores. o Use warm (not hot) water for cleansing. o Pat the area dry or use a hairdryer to evaporate any residual moisture. o If you use a hairdryer, use the cool or warm setting, to avoid potential heat injury to the skin. . Sitting on a pillow or ice pack may also provide relief. Do not apply ice directly to the skin, use a towel or pillow case as a buffer. We do not encourage sitting on an inflatable ring ("doughnut") as this leads to unopposed downward pressure in the anal area.  What symptoms should I expect? . It is normal to have pain for up to 2+ weeks. Thereafter, you may notice discomfort with prolonged sitting and certain activities. Pain should not be constant or worsening.  Pain Expectations and Narcotics: -After surgery you will have pain associated with your incisions and this is normal. The pain is muscular and nerve pain, and will get better with time. -You are encouraged and expected to take non narcotic medications like tylenol and ibuprofen (when able) to treat pain as multiple modalities can aid with pain treatment. -Narcotics are only used when pain is severe or there is breakthrough pain. -You are not expected to have a pain score of 0 after surgery, as we cannot prevent pain. A pain score of 3-4 that allows you to be functional, move, walk, and tolerate some activity is the goal. The pain will continue to improve over the days after surgery and is dependent on your surgery. -Due to  law, we are only able to give a  certain amount of pain medication to treat post operative pain, and we only give additional narcotics on a patient by patient basis.  -For most laparoscopic surgery, studies have shown that the majority of patients only need 10-15 narcotic pills, and for open surgeries most patients only need 15-20.   -Having appropriate expectations of pain and knowledge of pain management with non narcotics is important as we do not want anyone  to become addicted to narcotic pain medication.  -Using ice packs in the first 48 hours and heating pads after 48 hours, wearing an abdominal binder (when recommended), and using over the counter medications are all ways to help with pain management.   -Simple acts like meditation and mindfulness practices after surgery can also help with pain control and research has proven the benefit of these practices.  Contact Information: If you have questions or concerns, please call our office, [phone], Monday- Thursday 8AM-5PM and Friday 8AM-12Noon.  If it is after hours or on the weekend, please call [facility] Main Number, [phone], and ask to speak to the surgeon on call for Dr. Henreitta Leber at [facility].    How to Take a [employer] A sitz bath is a warm water bath that may be used to care for your rectum, genital area, or the area between your rectum and genitals (perineum). For a sitz bath, the water only comes up to your hips and covers your buttocks. A sitz bath may done at home in a bathtub or with a portable sitz bath that fits over the toilet. Your health care provider may recommend a sitz bath to help:  Relieve pain and discomfort after delivering a baby.  Relieve pain and itching from hemorrhoids or anal fissures.  Relieve pain after certain surgeries.  Relax muscles that are sore or tight. How to take a sitz bath Take 3-4 sitz baths a day, or as many as told by your health care provider. Bathtub sitz bath To take a sitz bath in a bathtub: 1. Partially fill a bathtub with warm water. The water should be deep enough to cover your hips and buttocks when you are sitting in the tub. 2. If your health care provider told you to put medicine in the water, follow his or her instructions. 3. Sit in the water. 4. Open the tub drain a little, and leave it open during your bath. 5. Turn on the warm water again, enough to replace the water that is draining out. Keep the water running throughout  your bath. This helps keep the water at the right level and the right temperature. 6. Soak in the water for 15-20 minutes, or as long as told by your health care provider. 7. When you are done, be careful when you stand up. You may feel dizzy. 8. After the sitz bath, pat yourself dry. Do not rub your skin to dry it.  Over-the-toilet sitz bath To take a sitz bath with an over-the-toilet basin: 1. Follow the manufacturer's instructions. 2. Fill the basin with warm water. 3. If your health care provider told you to put medicine in the water, follow his or her instructions. 4. Sit on the seat. Make sure the water covers your buttocks and perineum. 5. Soak in the water for 15-20 minutes, or as long as told by your health care provider. 6. After the sitz bath, pat yourself dry. Do not rub your skin to dry it. 7. Clean and dry the basin between uses. 8. Discard the basin if it  cracks, or according to the manufacturer's instructions. Contact a health care provider if:  Your symptoms get worse. Do not continue with sitz baths if your symptoms get worse.  You have new symptoms. If this happens, do not continue with sitz baths until you talk with your health care provider. Summary  A sitz bath is a warm water bath in which the water only comes up to your hips and covers your buttocks.  A sitz bath may help relieve itching, relieve pain, and relax muscles that are sore or tight in the lower part of your body, including your genital area.  Take 3-4 sitz baths a day, or as many as told by your health care provider. Soak in the water for 15-20 minutes.  Do not continue with sitz baths if your symptoms get worse. This information is not intended to replace advice given to you by your health care provider. Make sure you discuss any questions you have with your health care provider. Document Revised: 05/08/2019 Document Reviewed: 12/09/2017 Elsevier Patient Education  2020 [employer].

## 2020-11-11 NOTE — Op Note (Signed)
[facility] Operative Note  11/11/20  Preoperative Diagnosis: Infected perineal cyst/ abscess    Postoperative Diagnosis: Same   Procedure(s) Performed:  Excision of perineal cyst 2.5cm    Surgeon: James Abed C. Henreitta Leber, MD   Assistants: No qualified resident was available    Anesthesia: General endotracheal   Anesthesiologist: Dr. 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 He has had recurrent episodes in the last few weeks and we discussed excision of the area . We discussed the risk of bleeding, infection, finding something like a fistula to the anus, recurrence and possible need for packing if there is purulence at the time of surgery or if it gets infected.   Findings: Indurated woody tissue in the perineal region, no fistula tracks or pits to the perianal region    Procedure: The patient was taken to the operating room and placed supine. General endotracheal anesthesia was induced. Intravenous antibiotics were  administered per protocol.  The was then placed in lithotomy with pressure poitns padded. The perineum and perianal region were prepared and draped in the usual sterile fashion.   His perianal region demonstrated no evidence of tracks or pits from fistula like disease. There was induration in the right of midline area in his perineum between his anus and scrotum. This had been marked preoperatively as he reported this area getting smaller.  A small area of disrupted skin right of midline was noted. I ellipsed out the indurated skin just right of midline and carried this down to the subcutaneous tissue noting cyst wall/ cavity. This was removed in its entirety and measured about 2.5cm. The cavity was desiccated with  cautery to ensure no wall was remaining. The cavity was made hemostatic. The cavity was closed with interrupted 3-0 Vicryl sutures, and the skin was closed with 3-0 Nylon interrupted to allow for drainage if needed. The area was covered with neosporin. Mesh panties and ABD were placed.   Final inspection revealed acceptable hemostasis. All counts were correct at the end of the case. The patient was awakened from anesthesia and extubated without complication.  The patient went to the PACU in stable condition.   James Greenhouse, MD [facility] [address] [place] [place] [place], [place] [address] [phone] (office)

## 2020-11-11 NOTE — Anesthesia Preprocedure Evaluation (Signed)
Anesthesia Evaluation  Patient identified by MRN, date of birth, ID band Patient awake    Reviewed: Allergy & Precautions, NPO status , Patient's Chart, lab work & pertinent test results  History of Anesthesia Complications (+) history of anesthetic complications  Airway Mallampati: II  TM Distance: >3 FB Neck ROM: Full    Dental  (+) Dental Advisory Given, Teeth Intact   Pulmonary shortness of breath, asthma , neg sleep apnea (snoring), former smoker,  Snoring    Pulmonary exam normal breath sounds clear to auscultation       Cardiovascular Exercise Tolerance: Good Normal cardiovascular exam Rhythm:Regular Rate:Normal  10-Jan-2020 11:24:54 [facility]/ED ROUTINE RECORD Sinus rhythm Consider left atrial enlargement Nonspecific T abnormalities, lateral leads since last tracing no significant change Confirmed by Mancel Bale [phone]) on 01/10/2020 11:29:03 AM 50mm/s 94mm/mV 150Hz  9.0.4 CID: Confirmed By: [id number]   Neuro/Psych negative neurological ROS  negative psych ROS   GI/Hepatic negative GI ROS, Neg liver ROS,   Endo/Other  negative endocrine ROS  Renal/GU negative Renal ROS     Musculoskeletal negative musculoskeletal ROS (+)   Abdominal   Peds  Hematology negative hematology ROS (+)   Anesthesia Other Findings   Reproductive/Obstetrics negative OB ROS                            Anesthesia Physical Anesthesia Plan  ASA: II  Anesthesia Plan: General   Post-op Pain Management:    Induction: Intravenous  PONV Risk Score and Plan: Ondansetron, Dexamethasone and Midazolam  Airway Management Planned: LMA  Additional Equipment:   Intra-op Plan:   Post-operative Plan: Extubation in OR  Informed Consent: I have reviewed the patients History and Physical, chart, labs and discussed the procedure including the risks, benefits and alternatives for  the proposed anesthesia with the patient or authorized representative who has indicated his/her understanding and acceptance.     Dental advisory given  Plan Discussed with: CRNA and Surgeon  Anesthesia Plan Comments:        Anesthesia Quick Evaluation

## 2020-11-11 NOTE — Transfer of Care (Signed)
Immediate Anesthesia Transfer of Care Note  Patient: James Adams  Procedure(s) Performed: EXCISION PERINEAL INFECTED CYST (N/A Perineum)  Patient Location: PACU  Anesthesia Type:General  Level of Consciousness: awake, alert  and oriented  Airway & Oxygen Therapy: Patient Spontanous Breathing and Patient connected to face mask oxygen  Post-op Assessment: Report given to RN and Post -op Vital signs reviewed and stable  Post vital signs: Reviewed and stable  Last Vitals:  Vitals Value Taken Time  BP 90/59 11/11/20 1406  Temp    Pulse 82 11/11/20 1407  Resp 12 11/11/20 1407  SpO2 99 % 11/11/20 1407  Vitals shown include unvalidated device data.  Last Pain:  Vitals:   11/11/20 1140  TempSrc: Oral  PainSc: 0-No pain      Patients Stated Pain Goal: 8 (11/11/20 1140)  Complications: No complications documented.

## 2020-11-11 NOTE — Progress Notes (Signed)
[facility]  Spoke with his wife. Will see next week for wound check. Roxi sent to [facility]. Neosporin BID and pads. Expect some drainage. Sitz baths.   Algis Greenhouse, MD [facility] [address] [place] [place], [place] [address] [phone] (office)

## 2020-11-12 ENCOUNTER — Encounter (HOSPITAL_COMMUNITY): Payer: Self-pay | Admitting: General Surgery

## 2020-11-12 ENCOUNTER — Ambulatory Visit: Payer: Self-pay | Admitting: General Surgery

## 2020-11-13 LAB — SURGICAL PATHOLOGY

## 2020-11-19 ENCOUNTER — Other Ambulatory Visit: Payer: Self-pay

## 2020-11-19 ENCOUNTER — Encounter: Payer: Self-pay | Admitting: General Surgery

## 2020-11-19 ENCOUNTER — Ambulatory Visit (INDEPENDENT_AMBULATORY_CARE_PROVIDER_SITE_OTHER): Payer: Self-pay | Admitting: General Surgery

## 2020-11-19 VITALS — BP 137/86 | HR 94 | Temp 98.6°F | Resp 16 | Ht 72.0 in | Wt 291.0 lb

## 2020-11-19 DIAGNOSIS — L02215 Cutaneous abscess of perineum: Secondary | ICD-10-CM

## 2020-11-19 NOTE — Progress Notes (Signed)
[facility] Note   HPI:  44 y.o. Male presents to clinic for post-op follow-up evaluation of his perineal incision. He  Denies much drainage but says he is sore in the most inferior part of the wound. He has been using neosporin to the area.   Review of Systems:  No drainage No fevers  All other review of systems: otherwise negative    Pathology: FINAL MICROSCOPIC DIAGNOSIS:   A. PERINEAL, CYST, EXCISION:  - Benign skin and soft tissue with mixed inflammation and granulation  tissue.   COMMENT:   Residual cyst lining is not identified.   Vital Signs:  BP 137/86   Pulse 94   Temp 98.6 F (37 C) (Oral)   Resp 16   Ht 6' (1.829 m)   Wt 291 lb (132 kg)   SpO2 93%   BMI 39.47 kg/m    Physical Exam:  Physical Exam HENT:     Head: Normocephalic.  Cardiovascular:     Rate and Rhythm: Normal rate.  Pulmonary:     Effort: Pulmonary effort is normal.  Genitourinary:    Comments: Perineal incision healing, minor induration, inferior edge with open skin about 1cm X1cm cavity, silver nitrate and saline flushed, sutures removed, no erythema or drainage Neurological:     Mental Status: He is alert.     Assessment:  44 y.o. yo Male with excision of abscess cavity / ruptured cyst and pathology consistent with this diagnosis.  Plan:  Keep area clean and dry. Peroxide on qtip daily and neosporin daily to area. Will see back for wound check next week   Future Appointments  Date Time Provider Department Center  11/28/2020  1:15 PM Lucretia Roers, MD [facility] None     Algis Greenhouse, MD [facility] [address] [place], [place] [address] [phone] (office)

## 2020-11-19 NOTE — Patient Instructions (Signed)
Keep area clean and dry. Peroxide on qtip daily and neosporin daily to area.

## 2020-11-28 ENCOUNTER — Encounter: Payer: Self-pay | Admitting: General Surgery

## 2020-11-28 ENCOUNTER — Ambulatory Visit (INDEPENDENT_AMBULATORY_CARE_PROVIDER_SITE_OTHER): Payer: Self-pay | Admitting: General Surgery

## 2020-11-28 ENCOUNTER — Other Ambulatory Visit: Payer: Self-pay | Admitting: Family Medicine

## 2020-11-28 ENCOUNTER — Other Ambulatory Visit: Payer: Self-pay

## 2020-11-28 VITALS — BP 118/75 | HR 76 | Temp 98.0°F | Resp 16 | Ht 72.0 in | Wt 295.0 lb

## 2020-11-28 DIAGNOSIS — L02215 Cutaneous abscess of perineum: Secondary | ICD-10-CM

## 2020-11-28 NOTE — Progress Notes (Signed)
[facility] Note   HPI:  44 y.o. Male presents to clinic for post-op follow-up evaluation of his perineal cyst. Doing well and doing peroxide to the area. Improved pain.  Review of Systems:  No major pain Minor drainage  All other review of systems: otherwise negative   Vital Signs:  BP 118/75   Pulse 76   Temp 98 F (36.7 C) (Oral)   Resp 16   Ht 6' (1.829 m)   Wt 295 lb (133.8 kg)   SpO2 98%   BMI 40.01 kg/m    Physical Exam:  Physical Exam Vitals reviewed.  Cardiovascular:     Rate and Rhythm: Normal rate.  Pulmonary:     Effort: Pulmonary effort is normal.  Genitourinary:    Comments: Perineal incision healing, open area mid portion about 1cm wideX1cm long, very superficial, granulation tissue in the area, no erythema or drainage, silvernitrate to the tissue to help with epithelization     Assessment:  44 y.o. yo Male with healing I&D site. He is ready to return to work and is healing well.  Plan:  Ok to return to work without precautions 12/02/2020. Keep area clean and dry.  Peroxide daily to twice daily. Sitz baths for comfort.   Future Appointments  Date Time Provider Department Center  12/19/2020  9:15 AM Lucretia Roers, MD [facility] None     Algis Greenhouse, MD [facility] [address] [place] [place], [place] [address] [phone] (office)

## 2020-11-28 NOTE — Patient Instructions (Signed)
Ok to return to work without precautions 12/02/2020. Keep area clean and dry.  Peroxide daily to twice daily. Sitz baths for comfort.

## 2020-12-19 ENCOUNTER — Ambulatory Visit (INDEPENDENT_AMBULATORY_CARE_PROVIDER_SITE_OTHER): Payer: Self-pay | Admitting: General Surgery

## 2020-12-19 ENCOUNTER — Other Ambulatory Visit: Payer: Self-pay

## 2020-12-19 ENCOUNTER — Encounter: Payer: Self-pay | Admitting: General Surgery

## 2020-12-19 ENCOUNTER — Other Ambulatory Visit: Payer: Self-pay | Admitting: Family Medicine

## 2020-12-19 VITALS — BP 133/91 | HR 79 | Temp 98.2°F | Resp 16 | Ht 72.0 in | Wt 296.0 lb

## 2020-12-19 DIAGNOSIS — L02215 Cutaneous abscess of perineum: Secondary | ICD-10-CM

## 2020-12-19 NOTE — Patient Instructions (Signed)
Keep area clean. Use neosporin daily.

## 2020-12-19 NOTE — Progress Notes (Signed)
[facility] Note   HPI:  44 y.o. Male presents to clinic for follow-up evaluation of his perineal abscess /cyst excision. The perineal wound opened up and drained some yellow fluid he reports last week. He is otherwise well. He has been doing neosporin to the area.   Review of Systems:  No fever or chills Minor yellow drainage  All other review of systems: otherwise negative   Vital Signs:  BP (!) 133/91   Pulse 79   Temp 98.2 F (36.8 C) (Oral)   Resp 16   Ht 6' (1.829 m)   Wt 296 lb (134.3 kg)   SpO2 94%   BMI 40.14 kg/m    Physical Exam:  Physical Exam Vitals reviewed.  Cardiovascular:     Rate and Rhythm: Normal rate.  Pulmonary:     Effort: Pulmonary effort is normal.  Genitourinary:    Comments: Inferior aspect of perineal wound with opening/dehiscence and pink tissue, 0.25cm wide and 1cm long, no erythema or drainage, silver nitrate to the area, only goes about 0.25cm deep at the most inferior aspect Neurological:     Mental Status: He is alert.    Assessment:  44 y.o. yo Male with minor wound dehiscence of the perineal area. This is likely from activity and possibly seroma that drained.   Plan:  Keep area clean. Use neosporin daily.  Future Appointments  Date Time Provider Department Center  01/16/2021  3:30 PM Lucretia Roers, MD [facility] None    Algis Greenhouse, MD [facility] [address] [place] [address] [place], [place] [address] [phone] (office)

## 2021-01-16 ENCOUNTER — Ambulatory Visit: Payer: Self-pay | Admitting: General Surgery

## 2021-01-23 ENCOUNTER — Ambulatory Visit (INDEPENDENT_AMBULATORY_CARE_PROVIDER_SITE_OTHER): Payer: Self-pay | Admitting: General Surgery

## 2021-01-23 ENCOUNTER — Encounter: Payer: Self-pay | Admitting: General Surgery

## 2021-01-23 ENCOUNTER — Other Ambulatory Visit: Payer: Self-pay | Admitting: Family Medicine

## 2021-01-23 ENCOUNTER — Other Ambulatory Visit: Payer: Self-pay

## 2021-01-23 VITALS — BP 138/89 | HR 96 | Temp 97.2°F | Resp 18 | Ht 72.0 in | Wt 297.0 lb

## 2021-01-23 DIAGNOSIS — L723 Sebaceous cyst: Secondary | ICD-10-CM

## 2021-01-23 DIAGNOSIS — L089 Local infection of the skin and subcutaneous tissue, unspecified: Secondary | ICD-10-CM

## 2021-01-23 MED ORDER — DOXYCYCLINE HYCLATE 50 MG PO CAPS: 50.0000 mg | ORAL_CAPSULE | Freq: Two times a day (BID) | ORAL | 0 refills | Status: DC

## 2021-01-23 NOTE — Progress Notes (Signed)
[facility] Note   HPI:  45 y.o. Male presents to clinic for follow-up evaluation. He had a perineal abscess /cyst removed and now has a separate area on the more left perianal region that is indurated and getting swollen. He feels like it is another cyst. He denies any drainage.  Review of Systems:  No drainage Pain and swelling left perianal region All other review of systems: otherwise negative   Vital Signs:  BP 138/89   Pulse 96   Temp (!) 97.2 F (36.2 C) (Other (Comment))   Resp 18   Ht 6' (1.829 m)   Wt 297 lb (134.7 kg)   SpO2 94%   BMI 40.28 kg/m    Physical Exam:  Physical Exam Vitals reviewed.  Cardiovascular:     Rate and Rhythm: Normal rate.  Pulmonary:     Effort: Pulmonary effort is normal.  Genitourinary:    Comments: Healed right of midline perineal scar, no induration or drainage, left perianal region with induration and tenderness, no obvious area to drain Neurological:     Mental Status: He is alert.     Assessment:  45 y.o. yo Male with what seems like a second separate cyst that is getting infected versus a perianal abscess. This is new since the previous excision. Given that he now has an additional issues I want to ensure that we are not missing anything, so I think we should proceed with CT pelvis to evaluate further.   Plan:  CT will be ordered. It may be March before it is scheduled. Take Doxycyline. Will call with results. Call if changes.    Algis Greenhouse, MD [facility] [address] [place], [place] [address] [phone] (office)

## 2021-01-23 NOTE — Patient Instructions (Signed)
CT will be ordered. It may be March before it is scheduled. Take Doxycyline. Will call with results. Call if changes.

## 2021-01-30 ENCOUNTER — Ambulatory Visit: Payer: Self-pay | Admitting: General Surgery

## 2021-01-31 ENCOUNTER — Telehealth: Payer: Self-pay | Admitting: Family Medicine

## 2021-01-31 DIAGNOSIS — L723 Sebaceous cyst: Secondary | ICD-10-CM

## 2021-01-31 DIAGNOSIS — L089 Local infection of the skin and subcutaneous tissue, unspecified: Secondary | ICD-10-CM

## 2021-01-31 MED ORDER — DOXYCYCLINE HYCLATE 50 MG PO CAPS: 50.0000 mg | ORAL_CAPSULE | Freq: Two times a day (BID) | ORAL | 0 refills | Status: AC

## 2021-01-31 NOTE — Telephone Encounter (Signed)
Pt called and states the cyst has popped and is draining but he is out of antibx and would like some more called into his pharm please.  Per Dr. Henreitta Leber ok to refill last rx. Rx sent to pharm and pt aware.

## 2021-10-03 ENCOUNTER — Other Ambulatory Visit: Payer: Self-pay

## 2021-10-03 ENCOUNTER — Emergency Department (HOSPITAL_COMMUNITY)
Admission: EM | Admit: 2021-10-03 | Discharge: 2021-10-03 | Disposition: A | Discharge status: Home / Self Care | Payer: Self-pay | Attending: Emergency Medicine | Admitting: Emergency Medicine

## 2021-10-03 ENCOUNTER — Encounter (HOSPITAL_COMMUNITY): Payer: Self-pay | Admitting: *Deleted

## 2021-10-03 DIAGNOSIS — K148 Other diseases of tongue: Secondary | ICD-10-CM

## 2021-10-03 DIAGNOSIS — R21 Rash and other nonspecific skin eruption: Secondary | ICD-10-CM | POA: Insufficient documentation

## 2021-10-03 DIAGNOSIS — Z87891 Personal history of nicotine dependence: Secondary | ICD-10-CM | POA: Insufficient documentation

## 2021-10-03 DIAGNOSIS — J45909 Unspecified asthma, uncomplicated: Secondary | ICD-10-CM | POA: Insufficient documentation

## 2021-10-03 MED ORDER — NYSTATIN 100000 UNIT/ML MT SUSP: 500000.0000 [IU] | Freq: Four times a day (QID) | OROMUCOSAL | 0 refills | Status: AC

## 2021-10-03 NOTE — Discharge Instructions (Signed)
Call your primary care doctor or specialist as discussed in the next 2-3 days.   Return immediately back to the ER if:  Your symptoms worsen within the next 12-24 hours. You develop new symptoms such as new fevers, persistent vomiting, increased urinary frequency, increased thirst, new pains or any additional concerns.

## 2021-10-03 NOTE — ED Provider Notes (Signed)
[facility] EMERGENCY DEPARTMENT Provider Note   CSN: [id number] Arrival date & time: 10/03/21  1540     History Chief Complaint  Patient presents with   Rash    James Adams is a 45 y.o. male.  Patient complaint of rash on his tongue.  He states that he has had thrush in the past several months ago and is concerned that is recurrent again.  He states the last time he had it it was white and he took an oral medication and it resolved.  He was told that he does not have diabetes but it may be due to his vaping.  He quit smoking about 6 months ago.  He otherwise denies any headache or chest pain or abdominal pain.  No fever no cough no vomiting or diarrhea.  No oral or tongue pain.  He is drinking a Pepsi while we are taking the history.      Past Medical History:  Diagnosis Date   Asthma     Patient Active Problem List   Diagnosis Date Noted   Infected sebaceous cyst    Perineal abscess 11/05/2020   Dyspnea 01/22/2020   Chronic nasal congestion 01/22/2020   Snoring 01/22/2020    Past Surgical History:  Procedure Laterality Date   MASS EXCISION N/A 11/11/2020   Procedure: EXCISION PERINEAL INFECTED CYST;  Surgeon: Lucretia Roers, MD;  Location: [facility]S;  Service: General;  Laterality: N/A;       Family History  Problem Relation Age of Onset   Diabetes Mother    Diabetes Father     Social History   Tobacco Use   Smoking status: Former    Packs/day: 1.50    Years: 22.00    Pack years: 33.00    Types: Cigarettes    Quit date: 12/21/2017    Years since quitting: 3.7   Smokeless tobacco: Never  Vaping Use   Vaping Use: Former   Substances: Nicotine, Flavoring  Substance Use Topics   Alcohol use: Not Currently    Comment: occ   Drug use: Never    Home Medications Prior to Admission medications   Medication Sig Start Date End Date Taking? Authorizing Provider  albuterol (VENTOLIN HFA) 108 (90 Base) MCG/ACT inhaler Inhale 1-2 puffs into the lungs every 6  (six) hours as needed for wheezing or shortness of breath. 01/10/20   Joy, Hillard Danker, PA-C  aspirin-acetaminophen-caffeine (EXCEDRIN MIGRAINE) [phone] MG tablet Take 2 tablets by mouth every 6 (six) hours as needed for headache.     [provider]  ibuprofen (ADVIL) 800 MG tablet Take 800 mg by mouth every 8 (eight) hours as needed for moderate pain.    [provider]  loratadine (CLARITIN) 10 MG tablet Take 1 tablet (10 mg total) by mouth daily. 01/22/20 06/24/20  Leslye Peer, MD  montelukast (SINGULAIR) 10 MG tablet Take 1 tablet (10 mg total) by mouth at bedtime. Patient not taking: Reported on 01/22/2020 01/10/20 06/24/20  Anselm Pancoast, PA-C    Allergies    Patient has no known allergies.  Review of Systems   Review of Systems  Constitutional:  Negative for fever.  HENT:  Negative for ear pain and sore throat.   Eyes:  Negative for pain.  Respiratory:  Negative for cough.   Cardiovascular:  Negative for chest pain.  Gastrointestinal:  Negative for abdominal pain.  Genitourinary:  Negative for flank pain.  Musculoskeletal:  Negative for back pain.  Skin:  Negative for  color change and rash.  Neurological:  Negative for syncope.  All other systems reviewed and are negative.  Physical Exam Updated Vital Signs BP (!) 136/91 (BP Location: Right Arm)   Pulse (!) 114   Temp 99 F (37.2 C) (Oral)   Resp 18   Ht 6' (1.829 m)   Wt 125.9 kg   SpO2 94%   BMI 37.64 kg/m   Physical Exam Constitutional:      Appearance: He is well-developed.  HENT:     Head: Normocephalic.     Nose: Nose normal.     Mouth/Throat:     Comments: The surface of the tongue has a brown discoloration to it nontender.  Normal palpation no masses palpated.  Does not have the typical white appearance of thrush.  Unclear if this is due to discoloration from the Pepsi he was drinking.  Patient states he is not sure what color it was prior to drinking Pepsi. Eyes:     Extraocular Movements:  Extraocular movements intact.  Cardiovascular:     Rate and Rhythm: Normal rate.  Pulmonary:     Effort: Pulmonary effort is normal.  Skin:    Coloration: Skin is not jaundiced.  Neurological:     Mental Status: He is alert. Mental status is at baseline.    ED Results / Procedures / Treatments   Labs (all labs ordered are listed, but only abnormal results are displayed) Labs Reviewed - No data to display  EKG None  Radiology No results found.  Procedures Procedures   Medications Ordered in ED Medications - No data to display  ED Course  I have reviewed the triage vital signs and the nursing notes.  Pertinent labs & imaging results that were available during my care of the patient were reviewed by me and considered in my medical decision making (see chart for details).    MDM Rules/Calculators/A&P                           Patient presents with rash on his tongue brownish in color unclear etiology.  I did consider checking a blood sugar, but the patient states he is been drinking Pepsi for the last hour or so and will likely be erroneously elevated.  We will give him a trial of nystatin for home, advised outpatient follow-up with ENT within the week.  Advised follow-up with his primary care doctor within the week as well.  Advising immediate return if he has persistent rash, pain, increased urination thirst or fatigue to return immediately back to the ER.  Final Clinical Impression(s) / ED Diagnoses Final diagnoses:  Rash    Rx / DC Orders ED Discharge Orders     None        Cheryll Cockayne, MD 10/03/21 1659

## 2021-10-03 NOTE — ED Triage Notes (Signed)
Rash in mouth

## 2022-04-01 ENCOUNTER — Ambulatory Visit: Payer: BC Managed Care – PPO | Admitting: Family Medicine

## 2022-04-01 VITALS — BP 130/89 | HR 86 | Ht 70.5 in | Wt 273.0 lb

## 2022-04-01 DIAGNOSIS — M545 Low back pain, unspecified: Secondary | ICD-10-CM | POA: Diagnosis not present

## 2022-04-01 DIAGNOSIS — L299 Pruritus, unspecified: Secondary | ICD-10-CM

## 2022-04-01 DIAGNOSIS — R739 Hyperglycemia, unspecified: Secondary | ICD-10-CM

## 2022-04-01 DIAGNOSIS — Z130 Encounter for screening for diseases of the blood and blood-forming organs and certain disorders involving the immune mechanism: Secondary | ICD-10-CM

## 2022-04-01 DIAGNOSIS — E669 Obesity, unspecified: Secondary | ICD-10-CM | POA: Insufficient documentation

## 2022-04-01 DIAGNOSIS — R030 Elevated blood-pressure reading, without diagnosis of hypertension: Secondary | ICD-10-CM | POA: Insufficient documentation

## 2022-04-01 DIAGNOSIS — Z13220 Encounter for screening for lipoid disorders: Secondary | ICD-10-CM

## 2022-04-01 DIAGNOSIS — E1165 Type 2 diabetes mellitus with hyperglycemia: Secondary | ICD-10-CM

## 2022-04-01 MED ORDER — MELOXICAM 15 MG PO TABS: 15.0000 mg | ORAL_TABLET | Freq: Every day | ORAL | 0 refills | Status: DC

## 2022-04-01 MED ORDER — TRIAMCINOLONE ACETONIDE 0.1 % EX CREA: 1.0000 "application " | TOPICAL_CREAM | Freq: Two times a day (BID) | CUTANEOUS | 0 refills | Status: DC

## 2022-04-01 NOTE — Progress Notes (Signed)
? ?Subjective:  ?Patient ID: James Adams, male    DOB: [birth year]  Age: 46 y.o. MRN: [id number] ? ?CC: Establish care; back pain ? ?HPI ?James Adams is a 46 y.o. male presents to the clinic today for evaluation of the above. ? ?Patient reports a 3-week history of low back pain.  Predominantly on the right side.  He has had some paresthesias.  Denies any current numbness.  No saddle anesthesia.  He states that he has taken naproxen without relief.  Denies any recent fall, trauma, injury. ? ?Patient also reports ongoing itching in the inguinal region and around the testicles.  He states that he has been treated for jock itch on numerous occasions previously without resolution.  No current rash. ? ?Patient's blood pressure is elevated today.  No prior history of hypertension.  He is not on any prescription medications. ? ?PMH, Surgical Hx, Family Hx, Social History reviewed and updated as below. ? ?Past Medical History:  ?Diagnosis Date  ? Asthma   ? ?Past Surgical History:  ?Procedure Laterality Date  ? MASS EXCISION N/A 11/11/2020  ? Procedure: EXCISION PERINEAL INFECTED CYST;  Surgeon: Virl Cagey, MD;  Location: [facility];  Service: General;  Laterality: N/A;  ? ?Family History  ?Problem Relation Age of Onset  ? Diabetes Mother   ? Diabetes Father   ? ?Social History  ? ?Tobacco Use  ? Smoking status: Former  ?  Packs/day: 1.50  ?  Years: 22.00  ?  Pack years: 33.00  ?  Types: Cigarettes  ?  Quit date: 12/21/2017  ?  Years since quitting: 4.2  ? Smokeless tobacco: Never  ?Substance Use Topics  ? Alcohol use: Not Currently  ?  Comment: occ  ? ? ?Review of Systems ?Per HPI ? ?Objective:  ? ?Today's Vitals: BP 130/89   Pulse 86   Ht 5' 10.5" (1.791 m)   Wt 273 lb (123.8 kg)   BMI 38.62 kg/m?  ? ?Physical Exam ?Vitals and nursing note reviewed.  ?Constitutional:   ?   General: He is not in acute distress. ?   Appearance: Normal appearance. He is obese.  ?HENT:  ?   Head: Normocephalic and atraumatic.  ?Eyes:  ?    General:     ?   Right eye: No discharge.     ?   Left eye: No discharge.  ?   Conjunctiva/sclera: Conjunctivae normal.  ?Cardiovascular:  ?   Rate and Rhythm: Normal rate and regular rhythm.  ?   Heart sounds: No murmur heard. ?Pulmonary:  ?   Effort: Pulmonary effort is normal.  ?   Breath sounds: Normal breath sounds. No wheezing, rhonchi or rales.  ?Genitourinary: ?   Penis: Normal.   ?   Testes: Normal.  ?   Comments: No rash noted in the inguinal region. ?Neurological:  ?   Mental Status: He is alert.  ?Psychiatric:     ?   Mood and Affect: Mood normal.     ?   Behavior: Behavior normal.  ? ? ? ?Assessment & Plan:  ? ?Problem List Items Addressed This Visit   ? ?  ? Other  ? Elevated BP without diagnosis of hypertension  ?  Labs today.  Needs weight loss.  We will continue to monitor. ?  ?  ? Relevant Orders  ? CMP14+EGFR  ? Right-sided low back pain without sciatica  ?  Chronic, worsening.  ?Sending for x-ray lumbar spine.  Meloxicam as directed. ?  ?  ?  Relevant Medications  ? meloxicam (MOBIC) 15 MG tablet  ? Other Relevant Orders  ? DG Lumbar Spine Complete  ? Obesity (BMI 30-39.9)  ? Itching  ?  Patient reporting itching in the groin.  No appreciable rash.  Triamcinolone as directed.  If persist, will send to dermatology. ?  ?  ? ?Other Visit Diagnoses   ? ? Screening for deficiency anemia    -  Primary  ? Relevant Orders  ? CBC  ? Blood glucose elevated      ? Relevant Orders  ? Hemoglobin A1c  ? Screening, lipid      ? Relevant Orders  ? Lipid panel  ? ?  ? ? ?Meds ordered this encounter  ?Medications  ? meloxicam (MOBIC) 15 MG tablet  ?  Sig: Take 1 tablet (15 mg total) by mouth daily.  ?  Dispense:  30 tablet  ?  Refill:  0  ? triamcinolone cream (KENALOG) 0.1 %  ?  Sig: Apply 1 application. topically 2 (two) times daily.  ?  Dispense:  30 g  ?  Refill:  0  ? ?Thersa Salt DO ?[place] ? ? ? ? ?

## 2022-04-01 NOTE — Assessment & Plan Note (Signed)
Patient reporting itching in the groin.  No appreciable rash.  Triamcinolone as directed.  If persist, will send to dermatology. ?

## 2022-04-01 NOTE — Assessment & Plan Note (Signed)
Labs today.  Needs weight loss.  We will continue to monitor. ?

## 2022-04-01 NOTE — Assessment & Plan Note (Addendum)
Chronic, worsening.  ?Sending for x-ray lumbar spine.  Meloxicam as directed. ?

## 2022-04-01 NOTE — Patient Instructions (Signed)
Labs today. ? ?Xray at the hospital. ? ?Medication as prescribed. ? ?Follow up in 3-6 months. ? ?Take care ? ?Dr. Lacinda Axon  ?

## 2022-04-02 ENCOUNTER — Other Ambulatory Visit: Payer: Self-pay | Admitting: Family Medicine

## 2022-04-02 DIAGNOSIS — E1165 Type 2 diabetes mellitus with hyperglycemia: Secondary | ICD-10-CM

## 2022-04-02 LAB — CMP14+EGFR
ALT: 17 IU/L (ref 0–44)
AST: 17 IU/L (ref 0–40)
Albumin/Globulin Ratio: 1.8 (ref 1.2–2.2)
Albumin: 4.6 g/dL (ref 4.0–5.0)
Alkaline Phosphatase: 130 IU/L — ABNORMAL HIGH (ref 44–121)
BUN/Creatinine Ratio: 10 (ref 9–20)
BUN: 9 mg/dL (ref 6–24)
Bilirubin Total: 0.3 mg/dL (ref 0.0–1.2)
CO2: 23 mmol/L (ref 20–29)
Calcium: 9.8 mg/dL (ref 8.7–10.2)
Chloride: 101 mmol/L (ref 96–106)
Creatinine, Ser: 0.86 mg/dL (ref 0.76–1.27)
Globulin, Total: 2.5 g/dL (ref 1.5–4.5)
Glucose: 401 mg/dL — ABNORMAL HIGH (ref 70–99)
Potassium: 4.4 mmol/L (ref 3.5–5.2)
Sodium: 137 mmol/L (ref 134–144)
Total Protein: 7.1 g/dL (ref 6.0–8.5)
eGFR: 109 mL/min/{1.73_m2} (ref >59)

## 2022-04-02 LAB — HEMOGLOBIN A1C
Est. average glucose Bld gHb Est-mCnc: 301 mg/dL
Hgb A1c MFr Bld: 12.1 % — ABNORMAL HIGH (ref 4.8–5.6)

## 2022-04-02 LAB — LIPID PANEL
Chol/HDL Ratio: 6.7 ratio — ABNORMAL HIGH (ref 0.0–5.0)
Cholesterol, Total: 208 mg/dL — ABNORMAL HIGH (ref 100–199)
HDL: 31 mg/dL — ABNORMAL LOW (ref >39)
LDL Chol Calc (NIH): 93 mg/dL (ref 0–99)
Triglycerides: 505 mg/dL — ABNORMAL HIGH (ref 0–149)
VLDL Cholesterol Cal: 84 mg/dL — ABNORMAL HIGH (ref 5–40)

## 2022-04-02 LAB — CBC
Hematocrit: 45.7 % (ref 37.5–51.0)
Hemoglobin: 15.5 g/dL (ref 13.0–17.7)
MCH: 30.1 pg (ref 26.6–33.0)
MCHC: 33.9 g/dL (ref 31.5–35.7)
MCV: 89 fL (ref 79–97)
Platelets: 360 10*3/uL (ref 150–450)
RBC: 5.15 x10E6/uL (ref 4.14–5.80)
RDW: 13 % (ref 11.6–15.4)
WBC: 7.5 10*3/uL (ref 3.4–10.8)

## 2022-04-02 MED ORDER — METFORMIN HCL 500 MG PO TABS: 500.0000 mg | ORAL_TABLET | Freq: Two times a day (BID) | ORAL | 3 refills | Status: DC

## 2022-04-02 NOTE — Addendum Note (Signed)
Addended by: Margaretha Sheffield on: 04/02/2022 09:13 AM ? ? Modules accepted: Orders ? ?

## 2022-04-16 ENCOUNTER — Ambulatory Visit: Payer: BC Managed Care – PPO | Admitting: Family Medicine

## 2022-04-23 ENCOUNTER — Ambulatory Visit: Payer: BC Managed Care – PPO | Admitting: Family Medicine

## 2022-04-23 ENCOUNTER — Encounter: Payer: Self-pay | Admitting: Family Medicine

## 2022-04-23 ENCOUNTER — Encounter: Payer: Self-pay | Admitting: Nurse Practitioner

## 2022-04-23 ENCOUNTER — Ambulatory Visit (INDEPENDENT_AMBULATORY_CARE_PROVIDER_SITE_OTHER): Payer: BC Managed Care – PPO | Admitting: Nurse Practitioner

## 2022-04-23 VITALS — BP 133/85 | HR 110 | Ht 70.5 in | Wt 275.0 lb

## 2022-04-23 DIAGNOSIS — E119 Type 2 diabetes mellitus without complications: Secondary | ICD-10-CM

## 2022-04-23 DIAGNOSIS — E782 Mixed hyperlipidemia: Secondary | ICD-10-CM

## 2022-04-23 MED ORDER — ACCU-CHEK GUIDE VI STRP: ORAL_STRIP | 12 refills | Status: DC

## 2022-04-23 MED ORDER — ACCU-CHEK SOFTCLIX LANCETS MISC: 12 refills | Status: DC

## 2022-04-23 NOTE — Progress Notes (Signed)
? ?                                                    Endocrinology Consult Note  ?     04/23/2022, 2:37 PM ? ? ?Subjective:  ? ? Patient ID: James Adams, male    DOB: 03/20/1976.  ?James Adams is being seen in consultation for management of currently uncontrolled symptomatic diabetes requested by  Tommie Samsook, Jayce G, DO. ? ? ?Past Medical History:  ?Diagnosis Date  ? Asthma   ? ? ?Past Surgical History:  ?Procedure Laterality Date  ? MASS EXCISION N/A 11/11/2020  ? Procedure: EXCISION PERINEAL INFECTED CYST;  Surgeon: Lucretia RoersBridges, Lindsay C, MD;  Location: [facility];  Service: General;  Laterality: N/A;  ? ? ?Social History  ? ?Socioeconomic History  ? Marital status: Married  ?  Spouse name: Not on file  ? Number of children: Not on file  ? Years of education: Not on file  ? Highest education level: Not on file  ?Occupational History  ? Not on file  ?Tobacco Use  ? Smoking status: Former  ?  Packs/day: 1.50  ?  Years: 22.00  ?  Pack years: 33.00  ?  Types: Cigarettes  ?  Quit date: 12/21/2017  ?  Years since quitting: 4.3  ? Smokeless tobacco: Never  ?Vaping Use  ? Vaping Use: Former  ? Substances: Nicotine, Flavoring  ?Substance and Sexual Activity  ? Alcohol use: Not Currently  ?  Comment: occ  ? Drug use: Never  ? Sexual activity: Not on file  ?Other Topics Concern  ? Not on file  ?Social History Narrative  ? Not on file  ? ?Social Determinants of Health  ? ?Financial Resource Strain: Not on file  ?Food Insecurity: Not on file  ?Transportation Needs: Not on file  ?Physical Activity: Not on file  ?Stress: Not on file  ?Social Connections: Not on file  ? ? ?Family History  ?Problem Relation Age of Onset  ? Diabetes Mother   ? Diabetes Father   ? ? ?Outpatient Encounter Medications as of 04/23/2022  ?Medication Sig  ? Accu-Chek Softclix Lancets lancets Use as instructed to monitor glucose 4 times daily.  ? glucose blood (ACCU-CHEK GUIDE) test strip Use as instructed to monitor glucose 4 times daily  ? metFORMIN  (GLUCOPHAGE) 500 MG tablet Take 1 tablet (500 mg total) by mouth 2 (two) times daily with a meal.  ? [DISCONTINUED] loratadine (CLARITIN) 10 MG tablet Take 1 tablet (10 mg total) by mouth daily.  ? [DISCONTINUED] meloxicam (MOBIC) 15 MG tablet Take 1 tablet (15 mg total) by mouth daily.  ? [DISCONTINUED] montelukast (SINGULAIR) 10 MG tablet Take 1 tablet (10 mg total) by mouth at bedtime. (Patient not taking: Reported on 01/22/2020)  ? [DISCONTINUED] triamcinolone cream (KENALOG) 0.1 % Apply 1 application. topically 2 (two) times daily.  ? ?No facility-administered encounter medications on file as of 04/23/2022.  ? ? ?ALLERGIES: ?No Known Allergies ? ?VACCINATION STATUS: ?Immunization History  ?Administered Date(s) Administered  ? Moderna Sars-Covid-2 Vaccination 08/27/2020  ? ? ?Diabetes ?He presents for his initial diabetic visit. He has type 2 diabetes mellitus. Onset time: recently diagnosed 2 weeks ago at age of 46. His disease course has been fluctuating. Associated symptoms include blurred vision, fatigue, polydipsia, polyuria and weight loss. Symptoms are improving.  There are no diabetic complications. Risk factors for coronary artery disease include diabetes mellitus, dyslipidemia, family history, male sex and obesity. Current diabetic treatment includes oral agent (monotherapy). He is compliant with treatment most of the time. His weight is fluctuating minimally. He is following a generally unhealthy diet. When asked about meal planning, he reported none. He has not had a previous visit with a dietitian. He rarely participates in exercise. (He presents today for his consultation with no meter or logs to review.  He was recently diagnosed with diabetes 2 weeks ago, was not yet given a glucose meter to start monitoring glucose.  His most recent A1c was 12.1% on 4/12.  He has since cut out all sodas, drinks more water now, some flavored with crystal light, and sweet tea.  He skips meals frequently, snacking some  when he skips.  He recently started exercising more at the gym at his work (5 days per week).  He denies any s/s of hypoglycemia.  He is due for eye exam, has never seen podiatry in the past.) An ACE inhibitor/angiotensin II receptor blocker is not being taken. He does not see a podiatrist.Eye exam is current.  ? ? ?Review of systems ? ?Constitutional: + steadily decreasing body weight, current Body mass index is 38.9 kg/m?., + fatigue, no subjective hyperthermia, no subjective hypothermia ?Eyes: no blurry vision, no xerophthalmia ?ENT: no sore throat, no nodules palpated in throat, no dysphagia/odynophagia, no hoarseness ?Cardiovascular: no chest pain, no shortness of breath, no palpitations, no leg swelling ?Respiratory: no cough, no shortness of breath ?Gastrointestinal: no nausea/vomiting/diarrhea ?Musculoskeletal: no muscle/joint aches ?Skin: no rashes, no hyperemia ?Neurological: no tremors, no numbness, no tingling, no dizziness ?Psychiatric: no depression, no anxiety ? ?Objective:  ?  ? ?BP 133/85   Pulse (!) 110   Ht 5' 10.5" (1.791 m)   Wt 275 lb (124.7 kg)   BMI 38.90 kg/m?   ?Wt Readings from Last 3 Encounters:  ?04/23/22 275 lb (124.7 kg)  ?04/01/22 273 lb (123.8 kg)  ?10/03/21 277 lb 9 oz (125.9 kg)  ?  ? ?BP Readings from Last 3 Encounters:  ?04/23/22 133/85  ?04/01/22 130/89  ?10/03/21 127/78  ?  ? ?Physical Exam- Limited ? ?Constitutional:  Body mass index is 38.9 kg/m?. , not in acute distress, normal state of mind ?Eyes:  EOMI, no exophthalmos ?Neck: Supple ?Cardiovascular: RRR, no murmurs, rubs, or gallops, no edema ?Respiratory: Adequate breathing efforts, no crackles, rales, rhonchi, + wheezing BLL ?Musculoskeletal: no gross deformities, strength intact in all four extremities, no gross restriction of joint movements ?Skin:  no rashes, no hyperemia ?Neurological: no tremor with outstretched hands ? ? ? ?CMP ( most recent) ?CMP  ?   ?Component Value Date/Time  ? NA 137 04/01/2022 1508  ? K  4.4 04/01/2022 1508  ? CL 101 04/01/2022 1508  ? CO2 23 04/01/2022 1508  ? GLUCOSE 401 (H) 04/01/2022 1508  ? GLUCOSE 154 (H) 09/23/2020 0454  ? BUN 9 04/01/2022 1508  ? CREATININE 0.86 04/01/2022 1508  ? CALCIUM 9.8 04/01/2022 1508  ? PROT 7.1 04/01/2022 1508  ? ALBUMIN 4.6 04/01/2022 1508  ? AST 17 04/01/2022 1508  ? ALT 17 04/01/2022 1508  ? ALKPHOS 130 (H) 04/01/2022 1508  ? BILITOT 0.3 04/01/2022 1508  ? GFRNONAA >60 09/23/2020 0454  ? GFRAA >60 09/23/2020 0454  ? ? ? ?Diabetic Labs (most recent): ?Lab Results  ?Component Value Date  ? HGBA1C 12.1 (H) 04/01/2022  ? ? ? Lipid  Panel ( most recent) ?Lipid Panel  ?   ?Component Value Date/Time  ? CHOL 208 (H) 04/01/2022 1508  ? TRIG 505 (H) 04/01/2022 1508  ? HDL 31 (L) 04/01/2022 1508  ? CHOLHDL 6.7 (H) 04/01/2022 1508  ? LDLCALC 93 04/01/2022 1508  ? LABVLDL 84 (H) 04/01/2022 1508  ? ?  ? ?No results found for: TSH, FREET4  ? ?   ? ? ? ?Assessment & Plan:  ? ?1) Type 2 diabetes mellitus without complication, without long-term current use of insulin (HCC) ? ?He presents today for his consultation with no meter or logs to review.  He was recently diagnosed with diabetes 2 weeks ago, was not yet given a glucose meter to start monitoring glucose.  His most recent A1c was 12.1% on 4/12.  He has since cut out all sodas, drinks more water now, some flavored with crystal light, and sweet tea.  He skips meals frequently, snacking some when he skips.  He recently started exercising more at the gym at his work (5 days per week).  He denies any s/s of hypoglycemia.  He is due for eye exam, has never seen podiatry in the past. ? ?- James Adams has currently uncontrolled symptomatic type 2 DM since 46 years of age, with most recent A1c of 12.1 %.  ? ?-Recent labs reviewed. ? ?- I had a long discussion with him about the progressive nature of diabetes and the pathology behind its complications. ?-his diabetes is not currently complicated but he remains at a high risk for more  acute and chronic complications which include CAD, CVA, CKD, retinopathy, and neuropathy. These are all discussed in detail with him. ? ?The following Lifestyle Medicine recommendations according to [facility]

## 2022-04-23 NOTE — Patient Instructions (Signed)

## 2022-04-24 ENCOUNTER — Ambulatory Visit: Payer: BC Managed Care – PPO | Admitting: Family Medicine

## 2022-04-25 IMAGING — CT CT ABD-PELV W/ CM
2 of 5 series · 15 of 46 positions shown, 17 images · IV contrast (Omnipaque or Isovue)
Comparison: None.

CLINICAL DATA: Perineal abscess.

EXAM:
CT ABDOMEN AND PELVIS WITH CONTRAST
TECHNIQUE: Multidetector CT imaging of the abdomen and pelvis was performed
using the standard protocol following bolus administration of
intravenous contrast.
CONTRAST:  100mL OMNIPAQUE IOHEXOL 300 MG/ML  SOLN

[Series 2: axial st · axial · 0.96mm/px · z∈[+745,+1325]mm · 12 of 130 slices shown, 14 images]
[im 7/130  soft-tissue]
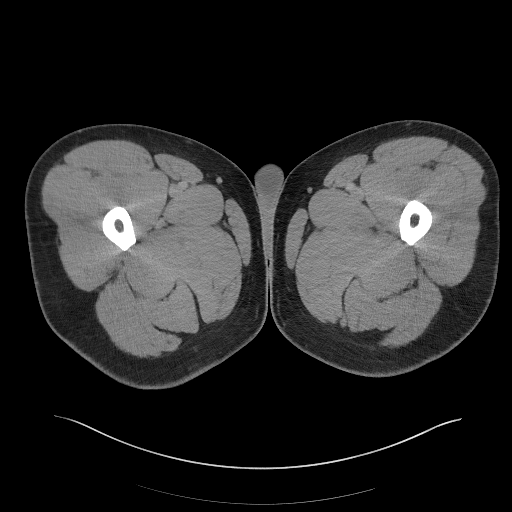
[im 7/130  bone]
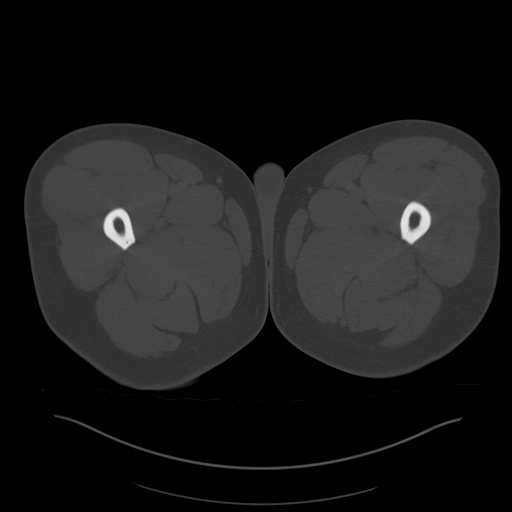
[im 20/130  soft-tissue]
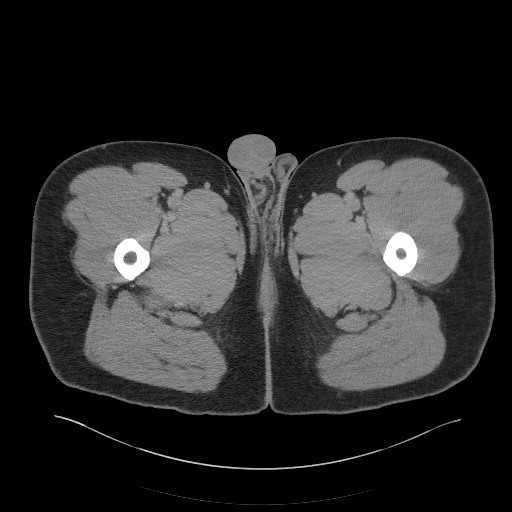
[im 26/130  soft-tissue]
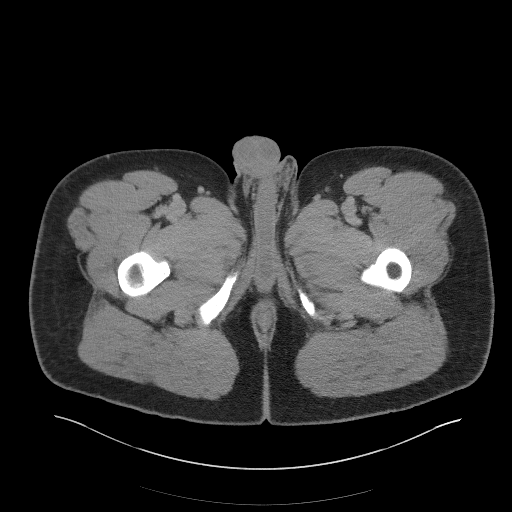
[im 39/130  soft-tissue]
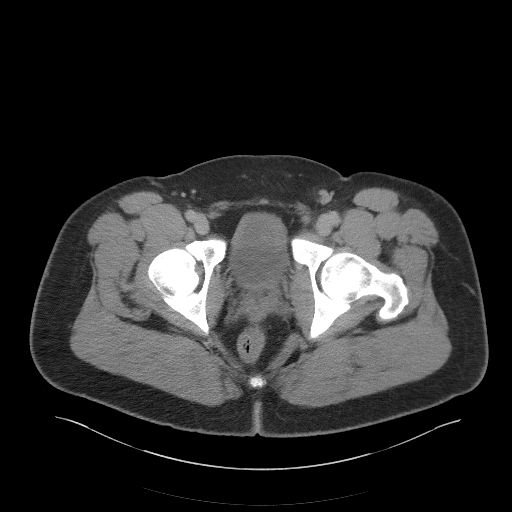
[im 52/130  soft-tissue]
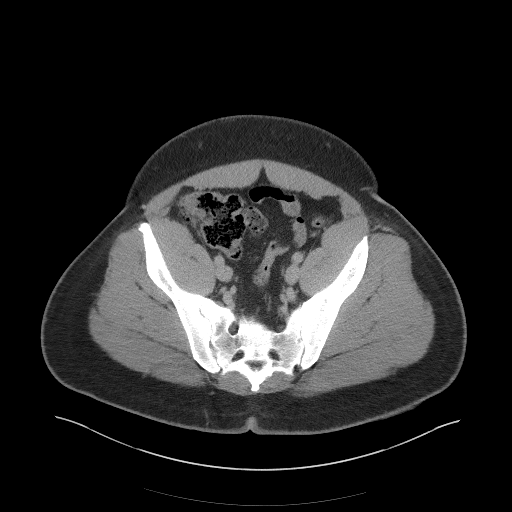
[im 59/130  soft-tissue]
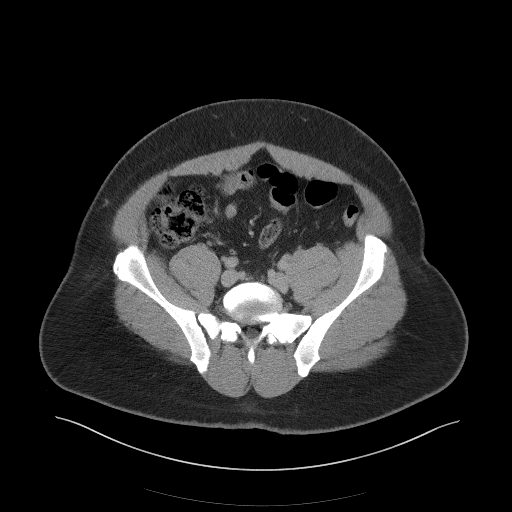
[im 71/130  soft-tissue]
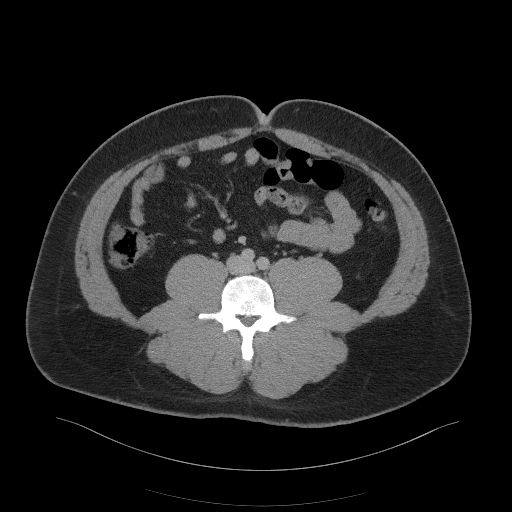
[im 78/130  soft-tissue]
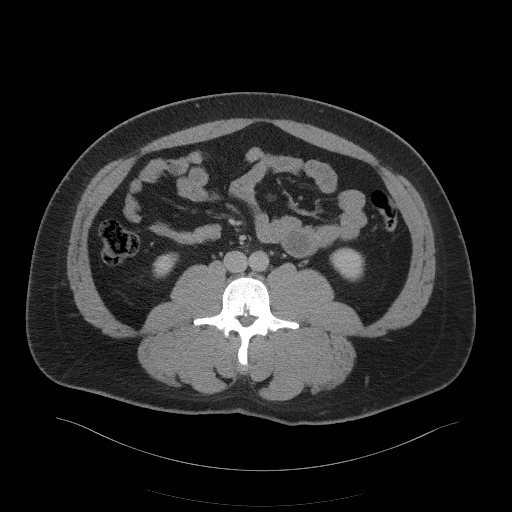
[im 91/130  soft-tissue]
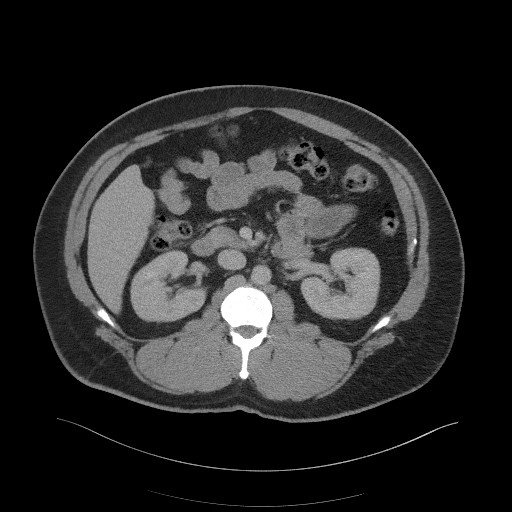
[im 91/130  bone]
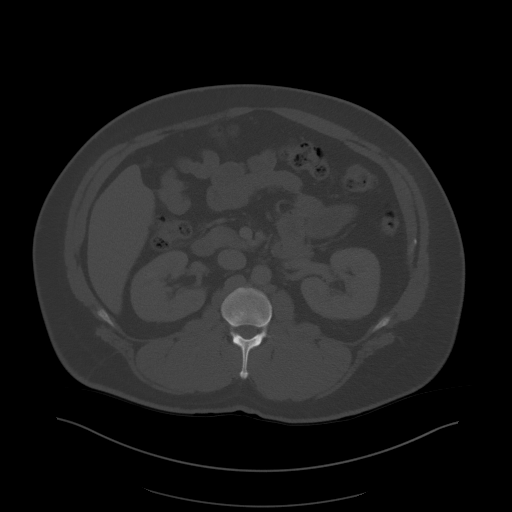
[im 104/130  soft-tissue]
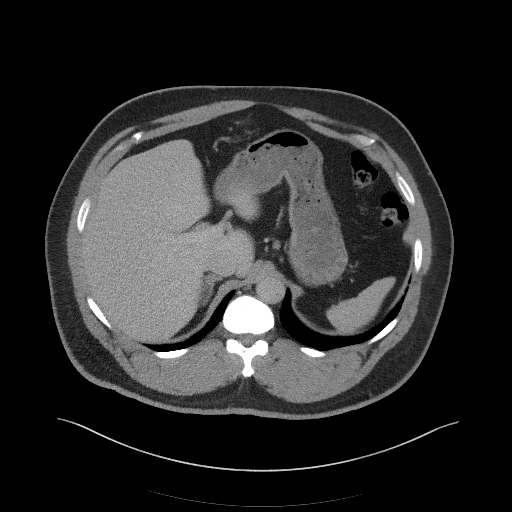
[im 110/130  soft-tissue]
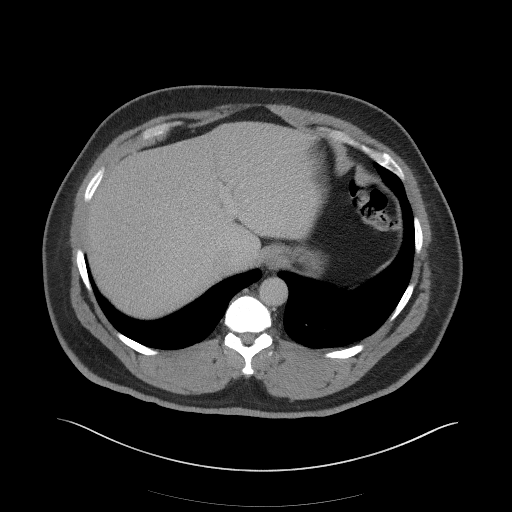
[im 123/130  soft-tissue]
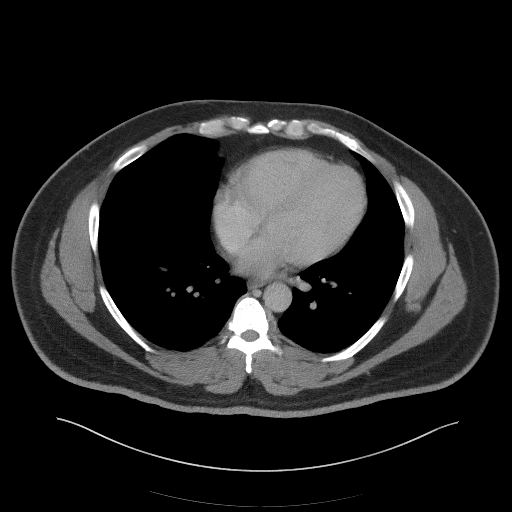

[Series 5: coronal st · coronal · 0.91mm/px · 3 of 117 slices shown]
[im 39/117  soft-tissue]
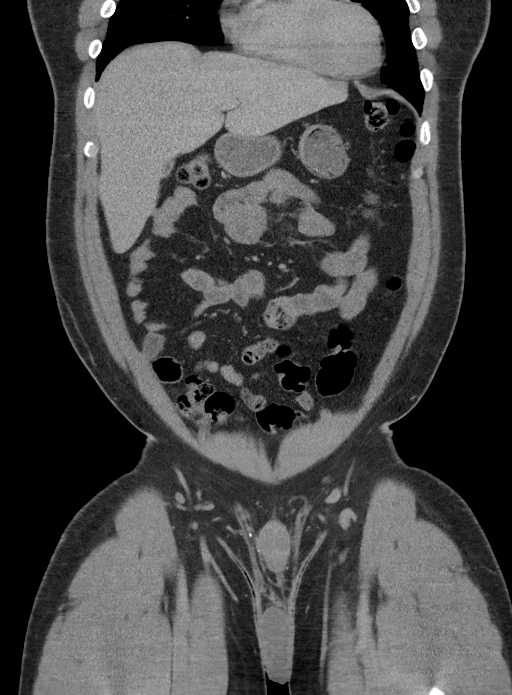
[im 52/117  soft-tissue]
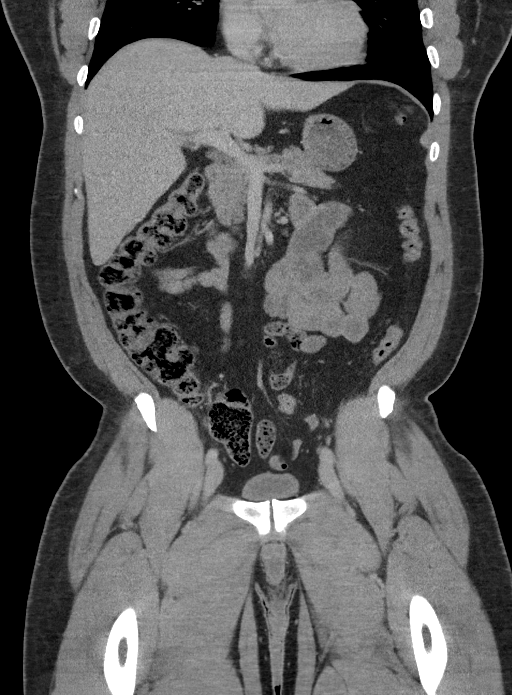
[im 65/117  soft-tissue]
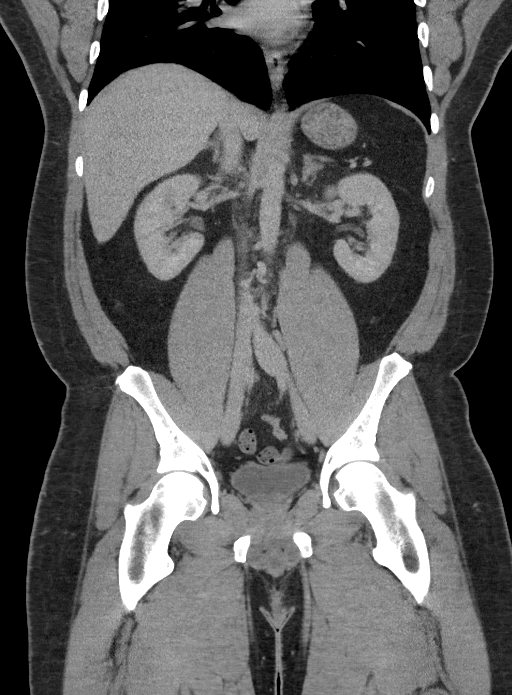

[15 of 46 positions shown; findings below may reference images not displayed]

FINDINGS: Lower chest: Unremarkable.

Hepatobiliary: No suspicious focal abnormality within the liver
parenchyma. Tiny hypodensity in the lateral hepatic dome is too
small to characterize at 6 mm but likely benign. Gallbladder is
nondistended. No intrahepatic or extrahepatic biliary dilation.

Pancreas: No focal mass lesion. No dilatation of the main duct. No
intraparenchymal cyst. No peripancreatic edema.

Spleen: No splenomegaly. No focal mass lesion.

Adrenals/Urinary Tract: No adrenal nodule or mass. 17 mm water
density lesion posterior lower pole right kidney is compatible with
a cyst. Similar 17 mm water density lesion in the upper pole left
kidney is most suggestive of a cyst. No evidence for hydroureter.
The urinary bladder appears normal for the degree of distention.

Stomach/Bowel: Stomach is unremarkable. No gastric wall thickening.
No evidence of outlet obstruction. Duodenum is normally positioned
as is the ligament of Treitz. No small bowel wall thickening. No
small bowel dilatation. The terminal ileum is normal. The appendix
is best seen on coronal images and is unremarkable. No gross colonic
mass. No colonic wall thickening.

Vascular/Lymphatic: No abdominal aortic aneurysm. No abdominal
aortic atherosclerotic calcification. There is no gastrohepatic or
hepatoduodenal ligament lymphadenopathy. No retroperitoneal or
mesenteric lymphadenopathy. No pelvic sidewall lymphadenopathy.
Upper normal to mildly enlarged groin nodes are evident bilaterally,
measuring up to 16 mm short axis on the left (image 96/series 2).

Reproductive: The prostate gland and seminal vesicles are
unremarkable.

Other: No intraperitoneal free fluid.

Musculoskeletal: 1.9 x 1.6 x 2.0 cm rim enhancing fluid collection
is identified in the superficial subcutaneous fat, just deep to the
skin of the left paramidline perineum. No worrisome lytic or
sclerotic osseous abnormality.
IMPRESSION: 1. 1.9 x 1.6 x 2.0 cm rim enhancing fluid collection in the
superficial subcutaneous fat, just deep to the skin of the left
paramidline perineum. Imaging features compatible with abscess. No
associated soft tissue gas.
2. Upper normal to mildly enlarged groin nodes bilaterally, likely
reactive. Follow-up CT in 3 months could be used to ensure
resolution as clinically warranted.
3. Bilateral renal cysts.

## 2022-04-28 ENCOUNTER — Other Ambulatory Visit: Payer: Self-pay | Admitting: Family Medicine

## 2022-05-01 ENCOUNTER — Ambulatory Visit: Payer: BC Managed Care – PPO | Admitting: Family Medicine

## 2022-05-07 ENCOUNTER — Ambulatory Visit: Payer: BC Managed Care – PPO | Admitting: Nurse Practitioner

## 2022-05-07 NOTE — Patient Instructions (Incomplete)

## 2022-07-03 ENCOUNTER — Ambulatory Visit (HOSPITAL_COMMUNITY)
Admission: EM | Admit: 2022-07-03 | Discharge: 2022-07-03 | Disposition: A | Discharge status: Home / Self Care | Payer: BC Managed Care – PPO | Attending: Internal Medicine | Admitting: Internal Medicine

## 2022-07-03 ENCOUNTER — Encounter (HOSPITAL_COMMUNITY): Payer: Self-pay | Admitting: Emergency Medicine

## 2022-07-03 DIAGNOSIS — H66002 Acute suppurative otitis media without spontaneous rupture of ear drum, left ear: Secondary | ICD-10-CM

## 2022-07-03 HISTORY — DX: Type 2 diabetes mellitus without complications: E11.9

## 2022-07-03 MED ORDER — AMOXICILLIN-POT CLAVULANATE 875-125 MG PO TABS: 1.0000 | ORAL_TABLET | Freq: Two times a day (BID) | ORAL | 0 refills | Status: DC

## 2022-07-03 MED ORDER — OFLOXACIN 0.3 % OT SOLN: 10.0000 [drp] | Freq: Every day | OTIC | 0 refills | Status: DC

## 2022-07-03 NOTE — ED Provider Notes (Signed)
[facility]    CSN: [id number] Arrival date & time: 07/03/22  1513      History   Chief Complaint Chief Complaint  Patient presents with   Otalgia    HPI James Adams is a 46 y.o. male.   Patient presents with left ear pain that has been present for a few days.  Denies trauma, foreign body, drainage, decreased hearing.  Patient does report that he had "cold-like symptoms" about a week ago that have now resolved.  Patient has used over-the-counter eardrops with minimal improvement of symptoms.  Denies any associated fevers.   Otalgia   Past Medical History:  Diagnosis Date   Asthma    Diabetes mellitus without complication [facility])     Patient Active Problem List   Diagnosis Date Noted   Elevated BP without diagnosis of hypertension 04/01/2022   Right-sided low back pain without sciatica 04/01/2022   Obesity (BMI 30-39.9) 04/01/2022   Itching 04/01/2022    Past Surgical History:  Procedure Laterality Date   MASS EXCISION N/A 11/11/2020   Procedure: EXCISION PERINEAL INFECTED CYST;  Surgeon: Lucretia Roers, MD;  Location: [facility];  Service: General;  Laterality: N/A;       Home Medications    Prior to Admission medications   Medication Sig Start Date End Date Taking? Authorizing Provider  amoxicillin-clavulanate (AUGMENTIN) 875-125 MG tablet Take 1 tablet by mouth every 12 (twelve) hours. 07/03/22  Yes Willadean Guyton, Acie Fredrickson, FNP  metFORMIN (GLUCOPHAGE) 500 MG tablet Take 1 tablet (500 mg total) by mouth 2 (two) times daily with a meal. 04/02/22  Yes Cook, Jayce G, DO  ofloxacin (FLOXIN) 0.3 % OTIC solution Place 10 drops into the left ear daily. 07/03/22  Yes Clytee Heinrich, Acie Fredrickson, FNP  Accu-Chek Softclix Lancets lancets Use as instructed to monitor glucose 4 times daily. 04/23/22   Dani Gobble, NP  glucose blood (ACCU-CHEK GUIDE) test strip Use as instructed to monitor glucose 4 times daily 04/23/22   Dani Gobble, NP  loratadine (CLARITIN) 10 MG tablet Take 1  tablet (10 mg total) by mouth daily. 01/22/20 06/24/20  Leslye Peer, MD  montelukast (SINGULAIR) 10 MG tablet Take 1 tablet (10 mg total) by mouth at bedtime. Patient not taking: Reported on 01/22/2020 01/10/20 06/24/20  Anselm Pancoast, PA-C    Family History Family History  Problem Relation Age of Onset   Diabetes Mother    Diabetes Father     Social History Social History   Tobacco Use   Smoking status: Former    Packs/day: 1.50    Years: 22.00    Total pack years: 33.00    Types: Cigarettes    Quit date: 12/21/2017    Years since quitting: 4.5   Smokeless tobacco: Never  Vaping Use   Vaping Use: Former   Substances: Nicotine, Flavoring  Substance Use Topics   Alcohol use: Not Currently    Comment: occ   Drug use: Never     Allergies   Patient has no known allergies.   Review of Systems Review of Systems Per HPI  Physical Exam Triage Vital Signs ED Triage Vitals  Enc Vitals Group     BP 07/03/22 1625 (!) 148/96     Pulse Rate 07/03/22 1625 (!) 113     Resp 07/03/22 1625 18     Temp 07/03/22 1625 98.4 F (36.9 C)     Temp Source 07/03/22 1625 Oral     SpO2 07/03/22 1625 92 %  Weight --      Height --      Head Circumference --      Peak Flow --      Pain Score 07/03/22 1629 4     Pain Loc --      Pain Edu? --      Excl. in GC? --    No data found.  Updated Vital Signs BP (!) 148/96 (BP Location: Right Arm)   Pulse (!) 107   Temp 98.4 F (36.9 C) (Oral)   Resp 18   SpO2 96%   Visual Acuity Right Eye Distance:   Left Eye Distance:   Bilateral Distance:    Right Eye Near:   Left Eye Near:    Bilateral Near:     Physical Exam Constitutional:      General: He is not in acute distress.    Appearance: Normal appearance. He is not toxic-appearing or diaphoretic.  HENT:     Head: Normocephalic and atraumatic.     Left Ear: External ear normal. Drainage present.     Ears:     Comments: Purulent and yellow discoloration to TM. It does not  appear perforated but unable to confirm given discoloration and mild drainage/erythema/ swelling to left external canal.  Eyes:     Extraocular Movements: Extraocular movements intact.     Conjunctiva/sclera: Conjunctivae normal.  Pulmonary:     Effort: Pulmonary effort is normal.  Neurological:     General: No focal deficit present.     Mental Status: He is alert and oriented to person, place, and time. Mental status is at baseline.  Psychiatric:        Mood and Affect: Mood normal.        Behavior: Behavior normal.        Thought Content: Thought content normal.        Judgment: Judgment normal.      UC Treatments / Results  Labs (all labs ordered are listed, but only abnormal results are displayed) Labs Reviewed - No data to display  EKG   Radiology No results found.  Procedures Procedures (including critical care time)  Medications Ordered in UC Medications - No data to display  Initial Impression / Assessment and Plan / UC Course  I have reviewed the triage vital signs and the nursing notes.  Pertinent labs & imaging results that were available during my care of the patient were reviewed by me and considered in my medical decision making (see chart for details).     Patient has significant suppurative left otitis media with possible associated left otitis externa.  Will treat with Augmentin and ofloxacin eardrops given unable to confirm if TM is intact.  TM appears to be intact but given swelling I am unable to confirm this.  Patient advised to follow-up if symptoms persist or worsen.  Discussed supportive care with patient.  Discussed return precautions.  Patient verbalized understanding and was agreeable with plan. Final Clinical Impressions(s) / UC Diagnoses   Final diagnoses:  Non-recurrent acute suppurative otitis media of left ear without spontaneous rupture of tympanic membrane     Discharge Instructions      You have an ear infection of your left ear  which is being treated with antibiotic pills as well as antibiotic eardrops.  Please follow-up if symptoms persist or worsen.    ED Prescriptions     Medication Sig Dispense Auth. Provider   amoxicillin-clavulanate (AUGMENTIN) 875-125 MG tablet Take 1 tablet by mouth  every 12 (twelve) hours. 14 tablet [place], Lowell E, [place]   ofloxacin (FLOXIN) 0.3 % OTIC solution Place 10 drops into the left ear daily. 5 mL Gustavus Bryant, [place]      PDMP not reviewed this encounter.   Gustavus Bryant, [place] 07/03/22 [phone]

## 2022-07-03 NOTE — Discharge Instructions (Signed)
You have an ear infection of your left ear which is being treated with antibiotic pills as well as antibiotic eardrops.  Please follow-up if symptoms persist or worsen. 

## 2022-07-03 NOTE — ED Triage Notes (Signed)
Patient c/o LFT ear pain x 1 week.   Patient denies ear drainage.   Patient endorses muffled sounds. Patient endorses itchiness at times.   Patient endorses pain upon palpation under ear lobe.   Patient attempted to flush ear with no relief of symptoms.

## 2022-08-25 ENCOUNTER — Encounter: Payer: Self-pay | Admitting: Family Medicine

## 2022-08-25 ENCOUNTER — Ambulatory Visit (INDEPENDENT_AMBULATORY_CARE_PROVIDER_SITE_OTHER): Payer: BC Managed Care – PPO | Admitting: General Surgery

## 2022-08-25 ENCOUNTER — Ambulatory Visit (HOSPITAL_COMMUNITY)
Admission: RE | Admit: 2022-08-25 | Discharge: 2022-08-25 | Disposition: A | Discharge status: Home / Self Care | Payer: BC Managed Care – PPO | Source: Ambulatory Visit | Attending: General Surgery | Admitting: General Surgery

## 2022-08-25 ENCOUNTER — Encounter: Payer: Self-pay | Admitting: General Surgery

## 2022-08-25 ENCOUNTER — Telehealth: Payer: Self-pay | Admitting: *Deleted

## 2022-08-25 VITALS — BP 140/91 | HR 115 | Temp 98.5°F | Resp 16 | Ht 70.5 in | Wt 284.0 lb

## 2022-08-25 DIAGNOSIS — R590 Localized enlarged lymph nodes: Secondary | ICD-10-CM | POA: Diagnosis not present

## 2022-08-25 DIAGNOSIS — K610 Anal abscess: Secondary | ICD-10-CM | POA: Diagnosis not present

## 2022-08-25 DIAGNOSIS — L02215 Cutaneous abscess of perineum: Secondary | ICD-10-CM | POA: Insufficient documentation

## 2022-08-25 MED ORDER — IOHEXOL 300 MG/ML  SOLN
100.0000 mL | Freq: Once | INTRAMUSCULAR | Status: AC | PRN
  Administered 2022-08-25: 80 mL via INTRAVENOUS

## 2022-08-25 MED ORDER — DOXYCYCLINE HYCLATE 50 MG PO CAPS: 50.0000 mg | ORAL_CAPSULE | Freq: Two times a day (BID) | ORAL | 0 refills | Status: AC

## 2022-08-25 MED ORDER — OXYCODONE HCL 5 MG PO TABS: 5.0000 mg | ORAL_TABLET | ORAL | 0 refills | Status: DC | PRN

## 2022-08-25 NOTE — Telephone Encounter (Signed)
Tommie Sams, DO     I agree. Thank you

## 2022-08-25 NOTE — Progress Notes (Signed)
[facility] History and Physical  Reason for Referral:*** Referring Physician: ***  Chief Complaint   Perineal abscess     Khaleel Beckom is a 46 y.o. male.  HPI: ***.  The *** started *** and has had a duration of ***.  It is associated with ***.  The *** is improved with ***, and is made worse with ***.    Quality*** Context***  Past Medical History:  Diagnosis Date  . Asthma   . Diabetes mellitus without complication [facility])     Past Surgical History:  Procedure Laterality Date  . MASS EXCISION N/A 11/11/2020   Procedure: EXCISION PERINEAL INFECTED CYST;  Surgeon: Lucretia Roers, MD;  Location: [facility];  Service: General;  Laterality: N/A;    Family History  Problem Relation Age of Onset  . Diabetes Mother   . Diabetes Father     Social History   Tobacco Use  . Smoking status: Former    Packs/day: 1.50    Years: 22.00    Total pack years: 33.00    Types: Cigarettes    Quit date: 12/21/2017    Years since quitting: 4.6  . Smokeless tobacco: Never  Vaping Use  . Vaping Use: Former  . Substances: Nicotine, Flavoring  Substance Use Topics  . Alcohol use: Not Currently    Comment: occ  . Drug use: Never    Medications: {medication reviewed/display:3041432} Allergies as of 08/25/2022   No Known Allergies      Medication List        Accurate as of August 25, 2022 12:14 PM. If you have any questions, ask your nurse or doctor.          STOP taking these medications    amoxicillin-clavulanate 875-125 MG tablet Commonly known as: AUGMENTIN Stopped by: Lucretia Roers, MD   ofloxacin 0.3 % OTIC solution Commonly known as: FLOXIN Stopped by: Lucretia Roers, MD       TAKE these medications    Accu-Chek Guide test strip Generic drug: glucose blood Use as instructed to monitor glucose 4 times daily   Accu-Chek Softclix Lancets lancets Use as instructed to monitor glucose 4 times daily.   metFORMIN 500 MG tablet Commonly  known as: GLUCOPHAGE Take 1 tablet (500 mg total) by mouth 2 (two) times daily with a meal.         ROS:  {Review of Systems:30496}  Blood pressure (!) 140/91, pulse (!) 115, temperature 98.5 F (36.9 C), temperature source Oral, resp. rate 16, height 5' 10.5" (1.791 m), weight 284 lb (128.8 kg), SpO2 93 %. Physical Exam  Results: No results found for this or any previous visit (from the past 48 hour(s)).  No results found.   Assessment & Plan:  Imer Foxworth is a 46 y.o. male with *** -*** -*** -Follow up ***  Future Appointments  Date Time Provider Department Center  08/27/2022  9:15 AM Lucretia Roers, MD [facility] None     All questions were answered to the satisfaction of the patient and family***.  The risk and benefits of *** were discussed including but not limited to ***.  After careful consideration, Maxtyn Nuzum has decided to ***.    Lucretia Roers 08/25/2022, 12:14 PM

## 2022-08-25 NOTE — Telephone Encounter (Signed)
Received call from patient wife, Rudi Coco [phone]~ telephone  Surgical Date: 11/11/2020 Procedure: excision perineal abscess  Patient wife reports that new area has formed in scrotal/ perineal area. States that area is swollen and painful. Appointment scheduled for evaluation.

## 2022-08-25 NOTE — Telephone Encounter (Signed)
Wife called and stated patient has a history of cyst or abscess in his scrotal area that he had to have surgery for earlier in the year. Wife states the area has been doing fine till this weekend and the area is swollen and painful to the point the patient can hardly walk. Advised wife that patient needs to go to ER for evaluation and treatment .

## 2022-08-25 NOTE — Patient Instructions (Signed)
Perineal /Perirectal abscess -Keep your stools soft and have a BM daily. Take fiber (Metamucil) over the counter daily. Take Colace twice daily if fiber does not keep your stools soft.  Take Miralax as needed if you still have not had a BM in 2 Days. -Take Sitz Baths (warm water baths) after every BM and when having discomfort.  -Take Ibuprofen and Tylenol alternating and the Narcotic pain medication for breakthrough pain. Get CT scan to further assess the area.

## 2022-08-26 NOTE — Progress Notes (Signed)
Looks like he does have an abscess in the area. I would recommend we try to drain it tomorrow. Make him aware so he is prepared.

## 2022-08-27 ENCOUNTER — Ambulatory Visit (INDEPENDENT_AMBULATORY_CARE_PROVIDER_SITE_OTHER): Payer: BC Managed Care – PPO | Admitting: General Surgery

## 2022-08-27 ENCOUNTER — Encounter: Payer: Self-pay | Admitting: General Surgery

## 2022-08-27 VITALS — BP 134/91 | HR 108 | Temp 98.9°F | Resp 18 | Ht 70.5 in | Wt 280.0 lb

## 2022-08-27 DIAGNOSIS — L02215 Cutaneous abscess of perineum: Secondary | ICD-10-CM | POA: Diagnosis not present

## 2022-08-27 LAB — POCT I-STAT CREATININE: Creatinine, Ser: 1 mg/dL (ref 0.61–1.24)

## 2022-08-27 NOTE — Progress Notes (Signed)
[facility] Procedure Note  08/27/22  Pre-procedure Diagnosis: Perineal abscess    Post-procedure Diagnosis: Same   Procedure(s) Performed: Incision and drainage perineal abscess    Surgeon: Lillia Abed C. Henreitta Leber, MD   Assistants: No qualified resident was available    Anesthesia: Lidocaine 1%    Specimens: None   Estimated Blood Loss: Minimal  Wound Class: Infected    Procedure Indications: James Adams is a 46 yo with a CT that demonstrates a 4cm abscess that he says did drain some but remains indurated with some fluctuance. We discussed incision and drainage and risk of bleeding, infection, local not working, and needing more formal exploration to assess for fistula in the future.   Findings: Perineal abscess left of midline (4cm cavity, bloody purulent drainage)    Procedure: The patient was taken to the procedure room and placed in the frog leg position. The perineal area was prepared and draped in the usual sterile fashion. Lidocaine 1% was injected into the draining area.  A 11 blade was used to open this 1 inch to allow for more adequate drainage. The cavity was irrigated and bloody purulent drainage was expressed. Iodoform packing was placed. Gauze and pad were given.    Final inspection revealed acceptable hemostasis. The patient tolerated the procedure well.   Sitz baths four times a day and after Bms. Remove packing tomorrow if it has not fallen out. No need to repack the area. Take the doxycyline.   Future Appointments  Date Time Provider Department Center  09/03/2022 10:30 AM Lucretia Roers, MD [facility] None     Algis Greenhouse, MD [facility] [address] [place] [place], [place] [address] [phone] (office)

## 2022-08-27 NOTE — Patient Instructions (Signed)
Sitz baths four times a day and after Bms.  Remove packing tomorrow if it has not fallen out. No need to repack the area.  Take the doxycyline.

## 2022-09-03 ENCOUNTER — Other Ambulatory Visit: Payer: Self-pay | Admitting: Nurse Practitioner

## 2022-09-03 ENCOUNTER — Ambulatory Visit (INDEPENDENT_AMBULATORY_CARE_PROVIDER_SITE_OTHER): Payer: BC Managed Care – PPO | Admitting: General Surgery

## 2022-09-03 ENCOUNTER — Encounter: Payer: Self-pay | Admitting: General Surgery

## 2022-09-03 VITALS — BP 135/95 | HR 93 | Temp 98.2°F | Resp 16 | Ht 70.5 in | Wt 255.0 lb

## 2022-09-03 DIAGNOSIS — E119 Type 2 diabetes mellitus without complications: Secondary | ICD-10-CM

## 2022-09-03 DIAGNOSIS — B379 Candidiasis, unspecified: Secondary | ICD-10-CM

## 2022-09-03 MED ORDER — BLOOD GLUCOSE METER KIT: PACK | 0 refills | Status: DC

## 2022-09-03 MED ORDER — FLUCONAZOLE 150 MG PO TABS: 150.0000 mg | ORAL_TABLET | Freq: Once | ORAL | 0 refills | Status: AC

## 2022-09-03 NOTE — Progress Notes (Unsigned)
[facility]  Minimal drainage. Feeling better. Pain resolving. Back at work.  BP (!) 135/95   Pulse 93   Temp 98.2 F (36.8 C) (Oral)   Resp 16   Ht 5' 10.5" (1.791 m)   Wt 255 lb (115.7 kg)   SpO2 95%   BMI 36.07 kg/m   Incision from I&D healed. Minor induration, some itching reported around the region and moisture.  Patient s/p I&D of perineal abscess. Doing better. Will need formal exploration in the upcoming weeks.  Call with changes. Take diflucan for the yeast/ itching/ moisture Keep the area dry.  Will see back to discuss formal excision.   Future Appointments  Date Time Provider Department Center  09/25/2022 11:30 AM Dani Gobble, NP [facility] None  10/27/2022 11:15 AM Lucretia Roers, MD [facility] None   Algis Greenhouse, MD [facility] [address] [place], [place] [address] [phone] (office)

## 2022-09-03 NOTE — Patient Instructions (Signed)
Call with changes. Take diflucan for the yeast/ itching/ moisture Keep the area dry.  Will see back to discuss formal excision.

## 2022-09-16 ENCOUNTER — Telehealth: Payer: Self-pay | Admitting: "Endocrinology

## 2022-09-16 MED ORDER — METFORMIN HCL 500 MG PO TABS: 500.0000 mg | ORAL_TABLET | Freq: Two times a day (BID) | ORAL | 0 refills | Status: DC

## 2022-09-16 NOTE — Telephone Encounter (Signed)
Patient is requesting a refill on his Metformin. He has 3 pills left. [facility]

## 2022-09-16 NOTE — Addendum Note (Signed)
Addended by: Brita Romp on: 09/16/2022 02:20 PM   Modules accepted: Orders

## 2022-09-16 NOTE — Telephone Encounter (Signed)
done

## 2022-09-16 NOTE — Telephone Encounter (Signed)
I sent in a limited refill.  He will need to keep his follow up appointments for future refills.

## 2022-09-16 NOTE — Telephone Encounter (Signed)
Patient's wife called and said it needs to be sent to [place], [place].

## 2022-09-25 ENCOUNTER — Ambulatory Visit: Payer: BC Managed Care – PPO | Admitting: Nurse Practitioner

## 2022-09-30 ENCOUNTER — Ambulatory Visit: Payer: BC Managed Care – PPO | Admitting: Nurse Practitioner

## 2022-09-30 DIAGNOSIS — E119 Type 2 diabetes mellitus without complications: Secondary | ICD-10-CM

## 2022-09-30 DIAGNOSIS — E782 Mixed hyperlipidemia: Secondary | ICD-10-CM

## 2022-09-30 NOTE — Telephone Encounter (Signed)
Pt no showed his appt- pt was made aware 2x and his wife he needed blood work for appt

## 2022-10-15 DIAGNOSIS — Z6839 Body mass index (BMI) 39.0-39.9, adult: Secondary | ICD-10-CM | POA: Diagnosis not present

## 2022-10-15 DIAGNOSIS — F17210 Nicotine dependence, cigarettes, uncomplicated: Secondary | ICD-10-CM | POA: Diagnosis not present

## 2022-10-15 DIAGNOSIS — R030 Elevated blood-pressure reading, without diagnosis of hypertension: Secondary | ICD-10-CM | POA: Diagnosis not present

## 2022-10-15 DIAGNOSIS — E669 Obesity, unspecified: Secondary | ICD-10-CM | POA: Diagnosis not present

## 2022-10-15 DIAGNOSIS — E119 Type 2 diabetes mellitus without complications: Secondary | ICD-10-CM | POA: Diagnosis not present

## 2022-10-15 DIAGNOSIS — E785 Hyperlipidemia, unspecified: Secondary | ICD-10-CM | POA: Diagnosis not present

## 2022-10-15 DIAGNOSIS — R42 Dizziness and giddiness: Secondary | ICD-10-CM | POA: Diagnosis not present

## 2022-10-21 ENCOUNTER — Ambulatory Visit: Payer: BC Managed Care – PPO | Admitting: General Surgery

## 2022-10-21 ENCOUNTER — Encounter: Payer: Self-pay | Admitting: *Deleted

## 2022-10-27 ENCOUNTER — Ambulatory Visit: Payer: BC Managed Care – PPO | Admitting: General Surgery

## 2022-11-03 ENCOUNTER — Other Ambulatory Visit: Payer: Self-pay

## 2022-11-03 ENCOUNTER — Ambulatory Visit (INDEPENDENT_AMBULATORY_CARE_PROVIDER_SITE_OTHER): Payer: BC Managed Care – PPO | Admitting: General Surgery

## 2022-11-03 ENCOUNTER — Telehealth: Payer: Self-pay

## 2022-11-03 ENCOUNTER — Encounter: Payer: Self-pay | Admitting: General Surgery

## 2022-11-03 VITALS — BP 152/88 | HR 89 | Temp 98.3°F | Resp 14 | Ht 72.0 in | Wt 267.0 lb

## 2022-11-03 DIAGNOSIS — L02215 Cutaneous abscess of perineum: Secondary | ICD-10-CM | POA: Diagnosis not present

## 2022-11-03 MED ORDER — TRIAMCINOLONE ACETONIDE 0.1 % EX CREA: 1.0000 | TOPICAL_CREAM | Freq: Two times a day (BID) | CUTANEOUS | 2 refills | Status: DC

## 2022-11-03 NOTE — Telephone Encounter (Signed)
Encourage patient to contact the pharmacy for refills or they can request refills through [facility]  (Please schedule appointment if patient has not been seen in over a year)    WHAT PHARMACY WOULD THEY LIKE THIS SENT TO: [facility]   MEDICATION NAME & DOSE:Triamcinolone Cream     Front office please notify patient: It takes 48-72 hours to process rx refill requests Ask patient to call pharmacy to ensure rx is ready before heading there.

## 2022-11-03 NOTE — Patient Instructions (Signed)
Anal Fistulotomy  Anal fistulotomy is a surgical procedure to open and drain an anal fistula. An anal fistula is an abnormal tunnel that develops between the bowel and the skin near the outside of the anus, where stool (feces) comes out. During this procedure, the fistula is opened up and the contents are drained to promote healing. Tell a health care provider about: Any allergies you have. All medicines you are taking, including vitamins, herbs, eye drops, creams, and over-the-counter medicines. Any problems you or family members have had with anesthetic medicines. Any blood disorders you have. Any surgeries you have had. Any medical conditions you have. Any problems you have controlling your bowel movements (incontinence). Whether you are pregnant or may be pregnant. What are the risks? Generally, this is a safe procedure. However, problems may occur, including: Infection. Bleeding. Allergic reactions to medicines or dyes. Damage to nearby structures or organs. Incontinence or leaking of stool. Being unable to empty your bladder (urinary retention). Needing more surgery, if the fistula returns. What happens before the procedure? Medicines Ask your health care provider about: Changing or stopping your regular medicines. This is especially important if you are taking diabetes medicines or blood thinners. Taking medicines such as aspirin and ibuprofen. These medicines can thin your blood. Do not take these medicines unless your health care provider tells you to take them. Taking over-the-counter medicines, vitamins, herbs, and supplements. Surgery safety Ask your health care provider: How your surgery site will be marked. What steps will be taken to help prevent infection. These may include: Removing hair at the surgery site. Washing skin with a germ-killing soap. Taking antibiotic medicine. General instructions Do not use any products that contain nicotine or tobacco for at least 4  weeks before the procedure. These products include cigarettes, e-cigarettes, and chewing tobacco. If you need help quitting, ask your health care provider. You may have an exam or testing. You may have a blood or urine sample taken. You may be told to take a medicine that helps you have a bowel movement (laxative) or use an enema to clean your bowels before surgery. Plan to have someone take you home from the hospital or clinic. Plan to have a responsible adult care for you for at least 24 hours after you leave the hospital or clinic. This is important. What happens during the procedure? An IV will be inserted into one of your veins. You will be given one or more of the following: A medicine to help you relax (sedative). A medicine to numb the area (local anesthetic). A medicine to make you fall asleep (general anesthetic). Your surgeon will locate the internal opening of your fistula. An incision will be made in the fistula opening. The incision may extend into the muscles around the anus (sphincter muscles). The fistula will be cut open and drained. Gauze bandages (dressings) may be placed inside of the fistula. The procedure may vary among health care providers and hospitals. What happens after the procedure?  Your blood pressure, heart rate, breathing rate, and blood oxygen level will be monitored until you leave the hospital or clinic. Do not drive for 24 hours if you were given a sedative during your procedure. You may continue to receive fluids and medicines through an IV. You may have some bleeding from your incision. You may have to wear a pad to absorb blood. Summary Anal fistulotomy is a surgical procedure to open and drain an anal fistula, which is an abnormal tunnel that develops between the  bowel and the skin near the outside of the anus. Before the procedure, tell your health care provider about any medical problems you have or have had, including problems controlling bowel  movements. You will be told what to eat and drink before the procedure, and what medicines to change or stop. Follow these instructions carefully. This is a safe procedure. However, problems may occur, including incontinence or leaking of stool and being unable to empty your bladder (urinary retention). You may have some bleeding from your incision after the procedure. You may have to wear a pad to absorb blood. This information is not intended to replace advice given to you by your health care provider. Make sure you discuss any questions you have with your health care provider. Document Revised: 05/22/2019 Document Reviewed: 05/22/2019 Elsevier Patient Education  [place].

## 2022-11-03 NOTE — Progress Notes (Signed)
[facility] Progress Note   Subjective: No major issues. No knot or pain. No drainage. Worried about having a fistula given the abscess.   Objective: Vital signs in last 24 hours: BP (!) 152/88   Pulse 89   Temp 98.3 F (36.8 C) (Oral)   Resp 14   Ht 6' (1.829 m)   Wt 267 lb (121.1 kg)   SpO2 93%   BMI 36.21 kg/m  Soft, nondistended, nontender Reg rate Normal breathing Perianal region exam deferred     Assessment/Plan: Patient s/p I&D of left perianal region abscess. Discussed potential of fistula and if this could cause another abscess. He wants to proceed with an exam under anesthesia, possible fistulotomy possible seton. Discussed risk of bleeding, infection, injury to sphincter, seton placement and more procedures.   He wants to wait until January. We will update his H&P at that time.  Current Outpatient Medications on File Prior to Visit  Medication Sig Dispense Refill   Accu-Chek Softclix Lancets lancets Use as instructed to monitor glucose 4 times daily. 100 each 12   blood glucose meter kit and supplies Dispense based on patient and insurance preference. Use to measure glucose twice daily as directed. (FOR ICD-10 E10.9, E11.9). 1 each 0   glucose blood (ACCU-CHEK GUIDE) test strip Use as instructed to monitor glucose 4 times daily 100 each 12   metFORMIN (GLUCOPHAGE) 500 MG tablet Take 1 tablet (500 mg total) by mouth 2 (two) times daily with a meal. 180 tablet 0   oxyCODONE (ROXICODONE) 5 MG immediate release tablet Take 1 tablet (5 mg total) by mouth every 4 (four) hours as needed for severe pain or breakthrough pain. 15 tablet 0   [DISCONTINUED] loratadine (CLARITIN) 10 MG tablet Take 1 tablet (10 mg total) by mouth daily. 30 tablet 5   [DISCONTINUED] montelukast (SINGULAIR) 10 MG tablet Take 1 tablet (10 mg total) by mouth at bedtime. (Patient not taking: Reported on 01/22/2020) 30 tablet 2   No current facility-administered medications on file  prior to visit.      Virl Cagey 11/03/2022

## 2022-11-03 NOTE — Telephone Encounter (Signed)
Medication sent in and pt is aware  

## 2023-01-11 NOTE — Patient Instructions (Signed)
James Adams  01/11/2023     @PREFPERIOPPHARMACY @   Your procedure is scheduled on  01/15/2023.   Report to [facility] at  0600  A.M.   Call this number if you have problems the morning of surgery:  [phone]  If you experience any cold or flu symptoms such as cough, fever, chills, shortness of breath, etc. between now and your scheduled surgery, please notify [phone] at the above number.   Remember:  Do not eat or drink after midnight.        DO NOT take any medications for diabetes the morning of your procedure.     Take these medicines the morning of surgery with A SIP OF WATER                            oxycodone(if needed).     Do not wear jewelry, make-up or nail polish.  Do not wear lotions, powders, or perfumes, or deodorant.  Do not shave 48 hours prior to surgery.  Men may shave face and neck.  Do not bring valuables to the hospital.  [facility] is not responsible for any belongings or valuables.  Contacts, dentures or bridgework may not be worn into surgery.  Leave your suitcase in the car.  After surgery it may be brought to your room.  For patients admitted to the hospital, discharge time will be determined by your treatment team.  Patients discharged the day of surgery will not be allowed to drive home and must have someone with them for 24 hours.    Special instructions:   DO NOT smoke tobacco or vape for 24 hours before your procedure.  Please read over the following fact sheets that you were given. Coughing and Deep Breathing, Surgical Site Infection Prevention, Anesthesia Post-op Instructions, and Care and Recovery After Surgery      Anal Fistulotomy, Care After This sheet gives you information about how to care for yourself after your procedure. Your health care provider may also give you more specific instructions. If you have problems or questions, contact your health care provider. What can I expect after the procedure? After the  procedure, it is common to have: Some pain, discomfort, and swelling. Increased pain during bowel movements. Some bleeding from the incision area. Some leakage of stool. Follow these instructions at home: Medicines Take over-the-counter and prescription medicines only as told by your health care provider. If you were prescribed an antibiotic medicine, take it as told by your health care provider. Do not stop taking the antibiotic even if you start to feel better. Ask your health care provider if the medicine prescribed to you requires you to avoid driving or using heavy machinery. Incision care  Follow instructions from your health care provider about how to take care of your incision. Make sure you: Wash your hands with soap and water before and after you remove your gauze (dressing). If soap and water are not available, use hand sanitizer. Remove your dressing as told by your health care provider. In some cases, you may be told not to remove the dressing, but to allow it to come out with your first bowel movement after surgery. Leave stitches (sutures), skin glue, or adhesive strips in place. These skin closures may need to stay in place for 2 weeks or longer. Do not remove adhesive strips completely unless your health care provider tells you to do that. Keep  the incision area clean and dry. Check your incision area every day for signs of infection. Check for: More redness, swelling, or pain. More fluid or blood. Warmth. Pus or a bad smell. Self-care After a bowel movement, clean the incision area using one of the following methods: Gently wipe with a moist towelette. Gently wipe with mild soap and water. Take a shower. Take a sitz bath. This is a shallow, warm-water bath that attaches to the toilet bowl. You can also sit in a bathtub filled with warm water. Do not swim or use a hot tub until your health care provider approves. To reduce discomfort, you may apply ice to the incision  area. To do this: Put ice in a plastic bag. Place a towel between your skin and the bag. Leave the ice on for 20 minutes, 2-3 times a day. Activity  Rest as told by your health care provider. Avoid sitting for a long time without moving. Get up to take short walks every 1-2 hours. This is important to improve blood flow and breathing. Ask for help if you feel weak or unsteady. Do not drive for 24 hours if you were given a sedative during your procedure. Do not lift anything that is heavier than 10 lb (4.5 kg), or the limit that you are told, until your health care provider says that it is safe. Return to your normal activities as told by your health care provider. Ask your health care provider what activities are safe for you. Eating and drinking Follow instructions from your health care provider about eating or drinking restrictions. You may need to take these actions to prevent or treat constipation: Drink enough fluid to keep your urine pale yellow. Eat foods that are high in fiber, such as beans, whole grains, and fresh fruits and vegetables. Limit foods that are high in fat and processed sugars, such as fried or sweet foods. Lifestyle Do not use any products that contain nicotine or tobacco, such as cigarettes, e-cigarettes, and chewing tobacco. If you need help quitting, ask your health care provider. Do not drink alcohol if: Your health care provider tells you not to drink. You are pregnant, may be pregnant, or are planning to become pregnant. If you drink alcohol: Limit how much you use to: 0-1 drink a day for women. 0-2 drinks a day for men. Be aware of how much alcohol is in your drink. In the [place] one drink equals one 12 oz bottle of beer (355 mL), one 5 oz glass of wine (148 mL), or one 1 oz glass of hard liquor (44 mL). General instructions If you have bleeding from the incision area, wear a pad to absorb blood. Change it often. Keep all follow-up visits as told by your  health care provider. This is important. Contact a health care provider if: You have any of these signs of infection: More redness, swelling, or pain around your incision area. Warmth coming from your incision area, or your incision area is firm. Pus or a bad smell coming from your incision area. A fever or chills. You develop swelling or tenderness in your groin area. You cannot control your bowel movements (incontinence), or you are leaking stool. You have trouble urinating. You have pain that does not get better with medicine. Get help right away if: You have severe pain in your abdomen or incision area. You have sudden chest pain. You become weak or you faint. You have more fluid or blood coming from your incision. You  have bleeding from your incision that soaks 2 or more pads during 24 hours. Summary After anal fistulotomy, it is common to have increased pain during bowel movements and some bleeding. If a dressing was placed in your incision during surgery, remove it as told by your health care provider. It is important to keep the incision area clean and dry after each bowel movement. If you have bleeding from the incision area, wear a pad to absorb blood. Change it often. This information is not intended to replace advice given to you by your health care provider. Make sure you discuss any questions you have with your health care provider. Document Revised: 05/22/2019 Document Reviewed: 05/22/2019 Elsevier Patient Education  2023 [employer] How to Use Chlorhexidine Before Surgery Chlorhexidine gluconate (CHG) is a germ-killing (antiseptic) solution that is used to clean the skin. It can get rid of the bacteria that normally live on the skin and can keep them away for about 24 hours. To clean your skin with CHG, you may be given: A CHG solution to use in the shower or as part of a sponge bath. A prepackaged cloth that contains CHG. Cleaning your skin with CHG may help lower the  risk for infection: While you are staying in the intensive care unit of the hospital. If you have a vascular access, such as a central line, to provide short-term or long-term access to your veins. If you have a catheter to drain urine from your bladder. If you are on a ventilator. A ventilator is a machine that helps you breathe by moving air in and out of your lungs. After surgery. What are the risks? Risks of using CHG include: A skin reaction. Hearing loss, if CHG gets in your ears and you have a perforated eardrum. Eye injury, if CHG gets in your eyes and is not rinsed out. The CHG product catching fire. Make sure that you avoid smoking and flames after applying CHG to your skin. Do not use CHG: If you have a chlorhexidine allergy or have previously reacted to chlorhexidine. On babies younger than 31 months of age. How to use CHG solution Use CHG only as told by your health care provider, and follow the instructions on the label. Use the full amount of CHG as directed. Usually, this is one bottle. During a shower Follow these steps when using CHG solution during a shower (unless your health care provider gives you different instructions): Start the shower. Use your normal soap and shampoo to wash your face and hair. Turn off the shower or move out of the shower stream. Pour the CHG onto a clean washcloth. Do not use any type of brush or rough-edged sponge. Starting at your neck, lather your body down to your toes. Make sure you follow these instructions: If you will be having surgery, pay special attention to the part of your body where you will be having surgery. Scrub this area for at least 1 minute. Do not use CHG on your head or face. If the solution gets into your ears or eyes, rinse them well with water. Avoid your genital area. Avoid any areas of skin that have broken skin, cuts, or scrapes. Scrub your back and under your arms. Make sure to wash skin folds. Let the lather  sit on your skin for 1-2 minutes or as long as told by your health care provider. Thoroughly rinse your entire body in the shower. Make sure that all body creases and crevices are rinsed well.  Dry off with a clean towel. Do not put any substances on your body afterward--such as powder, lotion, or perfume--unless you are told to do so by your health care provider. Only use lotions that are recommended by the manufacturer. Put on clean clothes or pajamas. If it is the night before your surgery, sleep in clean sheets.  During a sponge bath Follow these steps when using CHG solution during a sponge bath (unless your health care provider gives you different instructions): Use your normal soap and shampoo to wash your face and hair. Pour the CHG onto a clean washcloth. Starting at your neck, lather your body down to your toes. Make sure you follow these instructions: If you will be having surgery, pay special attention to the part of your body where you will be having surgery. Scrub this area for at least 1 minute. Do not use CHG on your head or face. If the solution gets into your ears or eyes, rinse them well with water. Avoid your genital area. Avoid any areas of skin that have broken skin, cuts, or scrapes. Scrub your back and under your arms. Make sure to wash skin folds. Let the lather sit on your skin for 1-2 minutes or as long as told by your health care provider. Using a different clean, wet washcloth, thoroughly rinse your entire body. Make sure that all body creases and crevices are rinsed well. Dry off with a clean towel. Do not put any substances on your body afterward--such as powder, lotion, or perfume--unless you are told to do so by your health care provider. Only use lotions that are recommended by the manufacturer. Put on clean clothes or pajamas. If it is the night before your surgery, sleep in clean sheets. How to use CHG prepackaged cloths Only use CHG cloths as told by your  health care provider, and follow the instructions on the label. Use the CHG cloth on clean, dry skin. Do not use the CHG cloth on your head or face unless your health care provider tells you to. When washing with the CHG cloth: Avoid your genital area. Avoid any areas of skin that have broken skin, cuts, or scrapes. Before surgery Follow these steps when using a CHG cloth to clean before surgery (unless your health care provider gives you different instructions): Using the CHG cloth, vigorously scrub the part of your body where you will be having surgery. Scrub using a back-and-forth motion for 3 minutes. The area on your body should be completely wet with CHG when you are done scrubbing. Do not rinse. Discard the cloth and let the area air-dry. Do not put any substances on the area afterward, such as powder, lotion, or perfume. Put on clean clothes or pajamas. If it is the night before your surgery, sleep in clean sheets.  For general bathing Follow these steps when using CHG cloths for general bathing (unless your health care provider gives you different instructions). Use a separate CHG cloth for each area of your body. Make sure you wash between any folds of skin and between your fingers and toes. Wash your body in the following order, switching to a new cloth after each step: The front of your neck, shoulders, and chest. Both of your arms, under your arms, and your hands. Your stomach and groin area, avoiding the genitals. Your right leg and foot. Your left leg and foot. The back of your neck, your back, and your buttocks. Do not rinse. Discard the cloth and let  the area air-dry. Do not put any substances on your body afterward--such as powder, lotion, or perfume--unless you are told to do so by your health care provider. Only use lotions that are recommended by the manufacturer. Put on clean clothes or pajamas. Contact a health care provider if: Your skin gets irritated after  scrubbing. You have questions about using your solution or cloth. You swallow any chlorhexidine. Call your local poison control center ([phone] in the [place] Get help right away if: Your eyes itch badly, or they become very red or swollen. Your skin itches badly and is red or swollen. Your hearing changes. You have trouble seeing. You have swelling or tingling in your mouth or throat. You have trouble breathing. These symptoms may represent a serious problem that is an emergency. Do not wait to see if the symptoms will go away. Get medical help right away. Call your local emergency services (911 in the [place] Do not drive yourself to the hospital. Summary Chlorhexidine gluconate (CHG) is a germ-killing (antiseptic) solution that is used to clean the skin. Cleaning your skin with CHG may help to lower your risk for infection. You may be given CHG to use for bathing. It may be in a bottle or in a prepackaged cloth to use on your skin. Carefully follow your health care provider's instructions and the instructions on the product label. Do not use CHG if you have a chlorhexidine allergy. Contact your health care provider if your skin gets irritated after scrubbing. This information is not intended to replace advice given to you by your health care provider. Make sure you discuss any questions you have with your health care provider. Document Revised: 04/06/2022 Document Reviewed: 02/17/2021 Elsevier Patient Education  2023 [employer].

## 2023-01-13 ENCOUNTER — Encounter (HOSPITAL_COMMUNITY)
Admission: RE | Admit: 2023-01-13 | Discharge: 2023-01-13 | Disposition: A | Discharge status: Home / Self Care | Payer: BC Managed Care – PPO | Source: Ambulatory Visit | Attending: General Surgery | Admitting: General Surgery

## 2023-01-13 ENCOUNTER — Encounter (HOSPITAL_COMMUNITY): Payer: Self-pay

## 2023-01-13 VITALS — BP 137/88 | HR 97 | Temp 97.8°F | Resp 18 | Ht 72.0 in | Wt 267.0 lb

## 2023-01-13 DIAGNOSIS — E119 Type 2 diabetes mellitus without complications: Secondary | ICD-10-CM

## 2023-01-13 DIAGNOSIS — J45909 Unspecified asthma, uncomplicated: Secondary | ICD-10-CM | POA: Diagnosis not present

## 2023-01-13 DIAGNOSIS — Z01812 Encounter for preprocedural laboratory examination: Secondary | ICD-10-CM | POA: Insufficient documentation

## 2023-01-13 DIAGNOSIS — F17210 Nicotine dependence, cigarettes, uncomplicated: Secondary | ICD-10-CM | POA: Diagnosis not present

## 2023-01-13 DIAGNOSIS — K219 Gastro-esophageal reflux disease without esophagitis: Secondary | ICD-10-CM | POA: Diagnosis not present

## 2023-01-13 DIAGNOSIS — K610 Anal abscess: Secondary | ICD-10-CM | POA: Diagnosis not present

## 2023-01-13 DIAGNOSIS — Z7984 Long term (current) use of oral hypoglycemic drugs: Secondary | ICD-10-CM | POA: Diagnosis not present

## 2023-01-13 HISTORY — DX: Gastro-esophageal reflux disease without esophagitis: K21.9

## 2023-01-13 LAB — BASIC METABOLIC PANEL
Anion gap: 9 (ref 5–15)
BUN: 12 mg/dL (ref 6–20)
CO2: 23 mmol/L (ref 22–32)
Calcium: 8.9 mg/dL (ref 8.9–10.3)
Chloride: 103 mmol/L (ref 98–111)
Creatinine, Ser: 0.84 mg/dL (ref 0.61–1.24)
GFR, Estimated: >60 mL/min (ref >60)
Glucose, Bld: 198 mg/dL — ABNORMAL HIGH (ref 70–99)
Potassium: 3.7 mmol/L (ref 3.5–5.1)
Sodium: 135 mmol/L (ref 135–145)

## 2023-01-13 LAB — HEMOGLOBIN A1C
Hgb A1c MFr Bld: 8.6 % — ABNORMAL HIGH (ref 4.8–5.6)
Mean Plasma Glucose: 200.12 mg/dL

## 2023-01-15 ENCOUNTER — Ambulatory Visit (HOSPITAL_COMMUNITY): Payer: BC Managed Care – PPO | Admitting: Anesthesiology

## 2023-01-15 ENCOUNTER — Ambulatory Visit (HOSPITAL_COMMUNITY)
Admission: RE | Admit: 2023-01-15 | Discharge: 2023-01-15 | Disposition: A | Discharge status: Home / Self Care | Payer: BC Managed Care – PPO | Attending: General Surgery | Admitting: General Surgery

## 2023-01-15 ENCOUNTER — Encounter (HOSPITAL_COMMUNITY)
Admission: RE | Disposition: A | Discharge status: Home / Self Care | Payer: Self-pay | Source: Home / Self Care | Attending: General Surgery

## 2023-01-15 ENCOUNTER — Encounter (HOSPITAL_COMMUNITY): Payer: Self-pay | Admitting: General Surgery

## 2023-01-15 DIAGNOSIS — E119 Type 2 diabetes mellitus without complications: Secondary | ICD-10-CM | POA: Insufficient documentation

## 2023-01-15 DIAGNOSIS — Z8719 Personal history of other diseases of the digestive system: Secondary | ICD-10-CM | POA: Diagnosis not present

## 2023-01-15 DIAGNOSIS — J45909 Unspecified asthma, uncomplicated: Secondary | ICD-10-CM | POA: Diagnosis not present

## 2023-01-15 DIAGNOSIS — K610 Anal abscess: Secondary | ICD-10-CM | POA: Insufficient documentation

## 2023-01-15 DIAGNOSIS — L02215 Cutaneous abscess of perineum: Secondary | ICD-10-CM | POA: Diagnosis present

## 2023-01-15 DIAGNOSIS — Z7984 Long term (current) use of oral hypoglycemic drugs: Secondary | ICD-10-CM | POA: Insufficient documentation

## 2023-01-15 DIAGNOSIS — F17210 Nicotine dependence, cigarettes, uncomplicated: Secondary | ICD-10-CM | POA: Insufficient documentation

## 2023-01-15 DIAGNOSIS — K219 Gastro-esophageal reflux disease without esophagitis: Secondary | ICD-10-CM | POA: Diagnosis not present

## 2023-01-15 HISTORY — PX: RECTAL EXAM UNDER ANESTHESIA: SHX6399

## 2023-01-15 LAB — GLUCOSE, CAPILLARY
Glucose-Capillary: 155 mg/dL — ABNORMAL HIGH (ref 70–99)
Glucose-Capillary: 164 mg/dL — ABNORMAL HIGH (ref 70–99)

## 2023-01-15 SURGERY — EXAM UNDER ANESTHESIA, RECTUM
Anesthesia: General | Site: Anus

## 2023-01-15 MED ORDER — SODIUM CHLORIDE 0.9 % IV SOLN
INTRAVENOUS | Status: AC
  Filled 2023-01-15: qty 2

## 2023-01-15 MED ORDER — CHLORHEXIDINE GLUCONATE CLOTH 2 % EX PADS: 6.0000 | MEDICATED_PAD | Freq: Once | CUTANEOUS | Status: DC

## 2023-01-15 MED ORDER — ONDANSETRON HCL 4 MG/2ML IJ SOLN: 4.0000 mg | Freq: Once | INTRAMUSCULAR | Status: DC | PRN

## 2023-01-15 MED ORDER — BUPIVACAINE LIPOSOME 1.3 % IJ SUSP
INTRAMUSCULAR | Status: DC | PRN
  Administered 2023-01-15: 20 mL

## 2023-01-15 MED ORDER — PROPOFOL 10 MG/ML IV BOLUS
INTRAVENOUS | Status: AC
  Filled 2023-01-15: qty 20

## 2023-01-15 MED ORDER — LACTATED RINGERS IV SOLN: INTRAVENOUS | Status: DC

## 2023-01-15 MED ORDER — SODIUM CHLORIDE 0.9 % IR SOLN
Status: DC | PRN
  Administered 2023-01-15: 1000 mL

## 2023-01-15 MED ORDER — MIDAZOLAM HCL 2 MG/2ML IJ SOLN
INTRAMUSCULAR | Status: AC
  Filled 2023-01-15: qty 2

## 2023-01-15 MED ORDER — FENTANYL CITRATE (PF) 250 MCG/5ML IJ SOLN
INTRAMUSCULAR | Status: DC | PRN
  Administered 2023-01-15 (×4): 50 ug via INTRAVENOUS

## 2023-01-15 MED ORDER — DEXAMETHASONE SODIUM PHOSPHATE 10 MG/ML IJ SOLN
INTRAMUSCULAR | Status: DC | PRN
  Administered 2023-01-15: 5 mg via INTRAVENOUS

## 2023-01-15 MED ORDER — FENTANYL CITRATE (PF) 100 MCG/2ML IJ SOLN
INTRAMUSCULAR | Status: AC
  Filled 2023-01-15: qty 2

## 2023-01-15 MED ORDER — BUPIVACAINE LIPOSOME 1.3 % IJ SUSP
INTRAMUSCULAR | Status: AC
  Filled 2023-01-15: qty 20

## 2023-01-15 MED ORDER — PROPOFOL 10 MG/ML IV BOLUS
INTRAVENOUS | Status: DC | PRN
  Administered 2023-01-15: 30 mg via INTRAVENOUS
  Administered 2023-01-15: 280 mg via INTRAVENOUS

## 2023-01-15 MED ORDER — METHYLENE BLUE 1 % INJ SOLN
INTRAVENOUS | Status: AC
  Filled 2023-01-15: qty 10

## 2023-01-15 MED ORDER — LIDOCAINE VISCOUS HCL 2 % MT SOLN
OROMUCOSAL | Status: AC
  Filled 2023-01-15: qty 15

## 2023-01-15 MED ORDER — MIDAZOLAM HCL 2 MG/2ML IJ SOLN
INTRAMUSCULAR | Status: DC | PRN
  Administered 2023-01-15: 2 mg via INTRAVENOUS

## 2023-01-15 MED ORDER — HYDROMORPHONE HCL 1 MG/ML IJ SOLN: 0.2500 mg | INTRAMUSCULAR | Status: DC | PRN

## 2023-01-15 MED ORDER — ORAL CARE MOUTH RINSE: 15.0000 mL | Freq: Once | OROMUCOSAL | Status: AC

## 2023-01-15 MED ORDER — DEXMEDETOMIDINE HCL IN NACL 80 MCG/20ML IV SOLN
INTRAVENOUS | Status: DC | PRN
  Administered 2023-01-15 (×3): 8 ug via BUCCAL

## 2023-01-15 MED ORDER — CHLORHEXIDINE GLUCONATE 0.12 % MT SOLN
15.0000 mL | Freq: Once | OROMUCOSAL | Status: AC
  Administered 2023-01-15: 15 mL via OROMUCOSAL

## 2023-01-15 MED ORDER — ONDANSETRON HCL 4 MG/2ML IJ SOLN
INTRAMUSCULAR | Status: DC | PRN
  Administered 2023-01-15: 4 mg via INTRAVENOUS

## 2023-01-15 MED ORDER — LIDOCAINE HCL (CARDIAC) PF 100 MG/5ML IV SOSY
PREFILLED_SYRINGE | INTRAVENOUS | Status: DC | PRN
  Administered 2023-01-15: 60 mg via INTRAVENOUS

## 2023-01-15 MED ORDER — HYDROGEN PEROXIDE 3 % EX SOLN
CUTANEOUS | Status: DC | PRN
  Administered 2023-01-15: 1

## 2023-01-15 MED ORDER — SODIUM CHLORIDE 0.9 % IV SOLN
INTRAVENOUS | Status: DC | PRN
  Administered 2023-01-15: 2 g via INTRAVENOUS

## 2023-01-15 MED ORDER — MEPERIDINE HCL 50 MG/ML IJ SOLN: 6.2500 mg | INTRAMUSCULAR | Status: DC | PRN

## 2023-01-15 SURGICAL SUPPLY — 32 items
BAG HAMPER (MISCELLANEOUS) ×2 IMPLANT
CANNULA VESSEL 3MM 2 BLNT TIP (CANNULA) ×2 IMPLANT
CLOTH BEACON ORANGE TIMEOUT ST (SAFETY) ×2 IMPLANT
COVER LIGHT HANDLE STERIS (MISCELLANEOUS) ×4 IMPLANT
ELECT REM PT RETURN 9FT ADLT (ELECTROSURGICAL) ×2
ELECTRODE REM PT RTRN 9FT ADLT (ELECTROSURGICAL) ×2 IMPLANT
GAUZE 4X4 16PLY ~~LOC~~+RFID DBL (SPONGE) ×1 IMPLANT
GAUZE SPONGE 4X4 12PLY STRL (GAUZE/BANDAGES/DRESSINGS) ×2 IMPLANT
GLOVE BIO SURGEON STRL SZ 6.5 (GLOVE) ×2 IMPLANT
GLOVE BIOGEL M 7.0 STRL (GLOVE) ×2 IMPLANT
GLOVE BIOGEL PI IND STRL 6.5 (GLOVE) ×2 IMPLANT
GLOVE BIOGEL PI IND STRL 7.0 (GLOVE) ×5 IMPLANT
GLOVE ECLIPSE 7.0 STRL STRAW (GLOVE) ×1 IMPLANT
GOWN STRL REUS W/TWL LRG LVL3 (GOWN DISPOSABLE) ×4 IMPLANT
KIT TURNOVER CYSTO (KITS) ×2 IMPLANT
MANIFOLD NEPTUNE II (INSTRUMENTS) ×2 IMPLANT
NDL HYPO 18GX1.5 BLUNT FILL (NEEDLE) IMPLANT
NDL HYPO 21X1 ECLIPSE (NEEDLE) IMPLANT
NDL HYPO 21X1.5 SAFETY (NEEDLE) ×1 IMPLANT
NEEDLE HYPO 18GX1.5 BLUNT FILL (NEEDLE) ×2 IMPLANT
NEEDLE HYPO 21X1 ECLIPSE (NEEDLE) ×2 IMPLANT
NEEDLE HYPO 21X1.5 SAFETY (NEEDLE) ×2 IMPLANT
NS IRRIG 1000ML POUR BTL (IV SOLUTION) ×2 IMPLANT
PACK PERI GYN (CUSTOM PROCEDURE TRAY) ×2 IMPLANT
PAD ARMBOARD 7.5X6 YLW CONV (MISCELLANEOUS) ×2 IMPLANT
SET BASIN LINEN APH (SET/KITS/TRAYS/PACK) ×2 IMPLANT
SPONGE SURGIFOAM ABS GEL 100 (HEMOSTASIS) IMPLANT
SURGILUBE 2OZ TUBE FLIPTOP (MISCELLANEOUS) ×2 IMPLANT
SUT SILK 0 FSL (SUTURE) ×1 IMPLANT
SYR 20ML LL LF (SYRINGE) ×4 IMPLANT
SYR 30ML LL (SYRINGE) ×2 IMPLANT
SYR CONTROL 10ML LL (SYRINGE) ×1 IMPLANT

## 2023-01-15 NOTE — Progress Notes (Signed)
[facility]  No fistula noted. Updated his wife. He only had needle sticks from peroxide and exparel. No incisions made.   He can return to work Monday. Needs better diabetes control and needs to speak with his pcp.   Curlene Labrum, MD [facility] [address] [place], [place] [id number] [phone] (office)

## 2023-01-15 NOTE — Op Note (Signed)
[facility] Operative Note  01/15/23  Preoperative Diagnosis: Perianal abscess    Postoperative Diagnosis: History of perianal abscess    Procedure(s) Performed: Exam under anesthesia, injection of peroxide    Surgeon: Lanell Matar. Constance Haw, MD   Assistants: No qualified resident was available    Anesthesia: General endotracheal   Anesthesiologist: Dr. 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 We discussed risk of bleeding, infection, finding fistula and doing fistulotomy versus seton.  We discussed finding nothing.   Findings: Areas injected with peroxide, no fistula track noted on anoscopy    Procedure: The patient was taken to the operating room and placed supine. General endotracheal anesthesia was induced. Intravenous antibiotics were  administered per protocol. He was then placed in lithotomy position. The anal and perineal region were prepared and draped in the usual sterile fashion.   An rectal exam demonstrated no masses or induration in the anal canal. The prior I&D site left perianal region was noted to be healed. His prior cyst excision in the perineal region was healed.  I injected both of these areas with peroxide and visualized with the anoscope the anal mucosa and noted no signs of a fistula track. I attempted to probe around the areas but found no tracks.   There is no obvious fistula.  Exparel was injected. Final inspection revealed acceptable hemostasis. All counts were correct at the end of the case. The patient was awakened from anesthesia and extubate without complication.  The patient went to the PACU in stable condition.  I think his infections are likely related to his diabetes  and need for further control.    Curlene Labrum, MD [facility] [address] [place],  [address] [phone] (office)

## 2023-01-15 NOTE — Discharge Instructions (Addendum)
No fistula was noted. You had no cuts to your skin, just needle sticks to inject some peroxide. Pain control with tylenol and ibuprofen.  I think getting your diabetes better under control will aid in preventing future infections.  Speak to your PCP about steps to get this down from 8 (already down from 12).      January 15, 2023   Patient: James Adams  Date of Birth: [birth year]  Date of Visit: 01/15/23      To Whom It May Concern:   Jerek Meulemans was seen and treated in our hospital  on 01/15/23. Taron Mondor  may return to work on 01/19/23 .   Sincerely,          V.O./Dr. Hali Marry

## 2023-01-15 NOTE — Progress Notes (Addendum)
  January 15, 2023  Patient: James Adams  Date of Birth: [birth year]  Date of Visit: 01/15/23    To Whom It May Concern:  Jamarea Selner was seen and treated in our hospital  on 01/15/23. Tylik Treese  may return to work on 01/19/23 .  Sincerely,      V.O./Dr. Hali Marry

## 2023-01-15 NOTE — Anesthesia Preprocedure Evaluation (Signed)
Anesthesia Evaluation  Patient identified by MRN, date of birth, ID band Patient awake    Reviewed: Allergy & Precautions, H&P , NPO status , Patient's Chart, lab work & pertinent test results  Airway Mallampati: II  TM Distance: >3 FB Neck ROM: Full    Dental no notable dental hx.    Pulmonary asthma , Current Smoker and Patient abstained from smoking.   Pulmonary exam normal breath sounds clear to auscultation       Cardiovascular Normal cardiovascular exam Rhythm:Regular Rate:Normal  10-Jan-2020 11:24:54 [facility]/ED ROUTINE RECORD Sinus rhythm Consider left atrial enlargement Nonspecific T abnormalities, lateral leads since last tracing no significant change Confirmed by Daleen Bo [phone]) on 01/10/2020 11:29:03 AM   Neuro/Psych negative neurological ROS  negative psych ROS   GI/Hepatic Neg liver ROS,GERD  Medicated,,  Endo/Other  negative endocrine ROSdiabetes, Well Controlled, Type 2, Oral Hypoglycemic Agents    Renal/GU negative Renal ROS  negative genitourinary   Musculoskeletal negative musculoskeletal ROS (+)    Abdominal   Peds negative pediatric ROS (+)  Hematology negative hematology ROS (+)   Anesthesia Other Findings   Reproductive/Obstetrics negative OB ROS                             Anesthesia Physical Anesthesia Plan  ASA: 2  Anesthesia Plan: General   Post-op Pain Management: Dilaudid IV   Induction: Intravenous  PONV Risk Score and Plan: 2 and Ondansetron and Dexamethasone  Airway Management Planned: LMA and Oral ETT  Additional Equipment:   Intra-op Plan:   Post-operative Plan: Extubation in OR  Informed Consent: I have reviewed the patients History and Physical, chart, labs and discussed the procedure including the risks, benefits and alternatives for the proposed anesthesia with the patient or authorized representative who has  indicated his/her understanding and acceptance.     Dental advisory given  Plan Discussed with: CRNA and Surgeon  Anesthesia Plan Comments:        Anesthesia Quick Evaluation

## 2023-01-15 NOTE — Transfer of Care (Signed)
Immediate Anesthesia Transfer of Care Note  Patient: Maveryk Renstrom  Procedure(s) Performed: RECTAL EXAM UNDER ANESTHESIA (Anus)  Patient Location: PACU  Anesthesia Type:General  Level of Consciousness: drowsy  Airway & Oxygen Therapy: Patient Spontanous Breathing and Patient connected to nasal cannula oxygen  Post-op Assessment: Report given to RN and Post -op Vital signs reviewed and stable  Post vital signs: Reviewed and stable  Last Vitals:  Vitals Value Taken Time  BP 86/57   Temp    Pulse 92 01/15/23 0908  Resp 14 01/15/23 0908  SpO2 92 % 01/15/23 0908  Vitals shown include unvalidated device data.  Last Pain:  Vitals:   01/15/23 0739  TempSrc: Oral  PainSc: 0-No pain         Complications: No notable events documented.

## 2023-01-15 NOTE — Anesthesia Procedure Notes (Signed)
Procedure Name: LMA Insertion Date/Time: 01/15/2023 8:38 AM  Performed by: Karna Dupes, CRNAPre-anesthesia Checklist: Emergency Drugs available, Patient identified, Suction available and Patient being monitored Patient Re-evaluated:Patient Re-evaluated prior to induction Oxygen Delivery Method: Circle system utilized Preoxygenation: Pre-oxygenation with 100% oxygen Induction Type: IV induction LMA: LMA inserted LMA Size: 5.0 Number of attempts: 1 Placement Confirmation: positive ETCO2 and breath sounds checked- equal and bilateral Tube secured with: Tape Dental Injury: Teeth and Oropharynx as per pre-operative assessment

## 2023-01-15 NOTE — Anesthesia Postprocedure Evaluation (Signed)
Anesthesia Post Note  Patient: James Adams  Procedure(s) Performed: RECTAL EXAM UNDER ANESTHESIA (Anus)  Patient location during evaluation: Phase II Anesthesia Type: General Level of consciousness: awake and alert and oriented Pain management: pain level controlled Vital Signs Assessment: post-procedure vital signs reviewed and stable Respiratory status: spontaneous breathing, nonlabored ventilation and respiratory function stable Cardiovascular status: blood pressure returned to baseline and stable Postop Assessment: no apparent nausea or vomiting Anesthetic complications: no  No notable events documented.   Last Vitals:  Vitals:   01/15/23 1000 01/15/23 1020  BP: 110/77 131/71  Pulse: 88 89  Resp: 14 16  Temp:  (!) 36.4 C  SpO2: 93% 95%    Last Pain:  Vitals:   01/15/23 1020  TempSrc: Oral  PainSc: 3                  Sabatino Williard C Tahisha Hakim

## 2023-01-15 NOTE — H&P (Signed)
[facility] History and Physical   James Adams is a 47 y.o. male.  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 He has been doing well so far since that time but given the recurrence we discussed that we would need to do an exam under anesthesia to ensure no fistula.   Past Medical History:  Diagnosis Date   Asthma    Diabetes mellitus without complication ([place])    GERD (gastroesophageal reflux disease)     Past Surgical History:  Procedure Laterality Date   MASS EXCISION N/A 11/11/2020   Procedure: EXCISION PERINEAL INFECTED CYST;  Surgeon: Virl Cagey, MD;  Location: [facility];  Service: General;  Laterality: N/A;    Family History  Problem Relation Age of Onset   Diabetes Mother    Diabetes Father     Social History   Tobacco Use   Smoking status: Every Day    Packs/day: 0.50    Years: 22.00    Total pack years: 11.00    Types: Cigarettes   Smokeless tobacco: Never  Vaping Use   Vaping Use: Former   Substances: Nicotine, Flavoring  Substance Use Topics   Alcohol use: Not Currently    Comment: occ   Drug use: Never    Medications: I have reviewed the patient's current medications. No current facility-administered medications on file prior to encounter.   Current Outpatient Medications on File Prior to Encounter  Medication Sig Dispense Refill   aspirin-acetaminophen-caffeine (EXCEDRIN MIGRAINE) 250-250-65 MG tablet Take 1-2 tablets by mouth every 6 (six) hours as needed for headache.     metFORMIN (GLUCOPHAGE) 850 MG tablet Take 850 mg by mouth 2 (two) times daily.     Accu-Chek Softclix Lancets lancets Use as instructed to monitor glucose 4 times daily. 100 each 12   blood glucose meter kit and supplies Dispense based on patient and insurance preference. Use to measure glucose twice daily as directed. (FOR ICD-10 E10.9, E11.9). 1 each 0   glucose blood (ACCU-CHEK GUIDE) test strip Use as instructed to  monitor glucose 4 times daily 100 each 12   metFORMIN (GLUCOPHAGE) 500 MG tablet Take 1 tablet (500 mg total) by mouth 2 (two) times daily with a meal. (Patient not taking: Reported on 01/12/2023) 180 tablet 0   oxyCODONE (ROXICODONE) 5 MG immediate release tablet Take 1 tablet (5 mg total) by mouth every 4 (four) hours as needed for severe pain or breakthrough pain. (Patient not taking: Reported on 01/12/2023) 15 tablet 0   [DISCONTINUED] loratadine (CLARITIN) 10 MG tablet Take 1 tablet (10 mg total) by mouth daily. 30 tablet 5   [DISCONTINUED] montelukast (SINGULAIR) 10 MG tablet Take 1 tablet (10 mg total) by mouth at bedtime. (Patient not taking: Reported on 01/22/2020) 30 tablet 2    No Known Allergies   ROS:  A comprehensive review of systems was negative except for: Gastrointestinal: positive for recent perianal abscess   Blood pressure (!) 141/84, pulse 88, temperature 98 F (36.7 C), temperature source Oral, resp. rate 16, height 6' (1.829 m), weight 121.1 kg, SpO2 98 %. Physical Exam Vitals reviewed.  HENT:     Head: Normocephalic.     Nose: Nose normal.  Cardiovascular:     Rate and Rhythm: Normal rate.  Pulmonary:     Effort: Pulmonary effort is normal.  Abdominal:     General: There is no distension.     Palpations: Abdomen is soft.  Musculoskeletal:  General: Normal range of motion.     Cervical back: Normal range of motion.  Skin:    General: Skin is warm.  Neurological:     General: No focal deficit present.     Mental Status: He is alert and oriented to person, place, and time.  Psychiatric:        Mood and Affect: Mood normal.        Behavior: Behavior normal.     Results: Results for orders placed or performed during the hospital encounter of 01/15/23 (from the past 48 hour(s))  Glucose, capillary     Status: Abnormal   Collection Time: 01/15/23  8:10 AM  Result Value Ref Range   Glucose-Capillary 155 (H) 70 - 99 mg/dL    Comment: Glucose reference  range applies only to samples taken after fasting for at least 8 hours.    Assessment & Plan:  James Adams is a 47 y.o. male with concern for fistula in ano. Discussed exam under anesthesia, risk of bleeding, infection, need for seton versus fistulotomy and future referral to colorectal if there was a fistula.     All questions were answered to the satisfaction of the patient.    Virl Cagey 01/15/2023, 8:21 AM

## 2023-01-18 ENCOUNTER — Other Ambulatory Visit: Payer: Self-pay

## 2023-01-22 ENCOUNTER — Encounter (HOSPITAL_COMMUNITY): Payer: Self-pay | Admitting: General Surgery

## 2023-01-28 ENCOUNTER — Ambulatory Visit (INDEPENDENT_AMBULATORY_CARE_PROVIDER_SITE_OTHER): Payer: BC Managed Care – PPO | Admitting: General Surgery

## 2023-01-28 ENCOUNTER — Encounter: Payer: Self-pay | Admitting: General Surgery

## 2023-01-28 ENCOUNTER — Other Ambulatory Visit: Payer: Self-pay | Admitting: *Deleted

## 2023-01-28 VITALS — BP 148/88 | HR 109 | Temp 98.4°F | Resp 18 | Ht 72.0 in | Wt 296.0 lb

## 2023-01-28 DIAGNOSIS — Z302 Encounter for sterilization: Secondary | ICD-10-CM

## 2023-01-28 DIAGNOSIS — L02215 Cutaneous abscess of perineum: Secondary | ICD-10-CM

## 2023-01-28 NOTE — Patient Instructions (Addendum)
Will refer you to Dr. Alyson Ingles for Vasectomy  Monitor area and call with issues

## 2023-01-28 NOTE — Progress Notes (Signed)
[facility]  Still sore where I injected peroxide no other issues. No fistula noted on exam.  BP (!) 148/88   Pulse (!) 109   Temp 98.4 F (36.9 C) (Oral)   Resp 18   Ht 6' (1.829 m)   Wt 296 lb (134.3 kg)   SpO2 95%   BMI 40.14 kg/m  Slight induration in the left of midline region, no erythema or drainage, healed incision from prior I&D  Patient s/p exam under anesthesia. Doing well. Wants referral for Vasectomy, will send to Dr. Alyson Ingles  PRN follow up  Curlene Labrum, MD [facility] [address] [place], [place] [address] [phone] (office)

## 2023-02-23 ENCOUNTER — Telehealth: Payer: Self-pay | Admitting: Nurse Practitioner

## 2023-02-23 ENCOUNTER — Other Ambulatory Visit: Payer: Self-pay | Admitting: Nurse Practitioner

## 2023-02-23 DIAGNOSIS — E119 Type 2 diabetes mellitus without complications: Secondary | ICD-10-CM

## 2023-02-23 NOTE — Telephone Encounter (Signed)
No refills, I gave him a limited supply last time he asked for refill but said he needed to keep his appt.  I have entered lab orders.

## 2023-02-23 NOTE — Telephone Encounter (Signed)
Pt's wife is asking for a refill on metformin. I made her aware he has not been here since May 2023.  It appears he has no showed the last few appointments and I know that he needs blood work. Can you update labs?  [facility] in [place] Call his wife # [phone]

## 2023-02-24 NOTE — Telephone Encounter (Signed)
I have sent him a mychart message 

## 2023-03-08 ENCOUNTER — Encounter: Payer: Self-pay | Admitting: *Deleted

## 2023-06-04 ENCOUNTER — Other Ambulatory Visit: Payer: Self-pay | Admitting: Nurse Practitioner

## 2023-06-21 DIAGNOSIS — Z6838 Body mass index (BMI) 38.0-38.9, adult: Secondary | ICD-10-CM | POA: Diagnosis not present

## 2023-06-21 DIAGNOSIS — E119 Type 2 diabetes mellitus without complications: Secondary | ICD-10-CM | POA: Diagnosis not present

## 2023-06-21 DIAGNOSIS — R030 Elevated blood-pressure reading, without diagnosis of hypertension: Secondary | ICD-10-CM | POA: Diagnosis not present

## 2023-06-21 DIAGNOSIS — E6609 Other obesity due to excess calories: Secondary | ICD-10-CM | POA: Diagnosis not present

## 2023-06-21 DIAGNOSIS — F17200 Nicotine dependence, unspecified, uncomplicated: Secondary | ICD-10-CM | POA: Diagnosis not present

## 2023-06-21 DIAGNOSIS — Z79899 Other long term (current) drug therapy: Secondary | ICD-10-CM | POA: Diagnosis not present

## 2023-07-05 DIAGNOSIS — Z6838 Body mass index (BMI) 38.0-38.9, adult: Secondary | ICD-10-CM | POA: Diagnosis not present

## 2023-07-05 DIAGNOSIS — E119 Type 2 diabetes mellitus without complications: Secondary | ICD-10-CM | POA: Diagnosis not present

## 2023-07-05 DIAGNOSIS — E6609 Other obesity due to excess calories: Secondary | ICD-10-CM | POA: Diagnosis not present

## 2023-07-05 DIAGNOSIS — R030 Elevated blood-pressure reading, without diagnosis of hypertension: Secondary | ICD-10-CM | POA: Diagnosis not present

## 2023-07-05 DIAGNOSIS — F17200 Nicotine dependence, unspecified, uncomplicated: Secondary | ICD-10-CM | POA: Diagnosis not present

## 2023-07-26 DIAGNOSIS — R059 Cough, unspecified: Secondary | ICD-10-CM | POA: Diagnosis not present

## 2023-07-26 DIAGNOSIS — Z6838 Body mass index (BMI) 38.0-38.9, adult: Secondary | ICD-10-CM | POA: Diagnosis not present

## 2023-07-26 DIAGNOSIS — U071 COVID-19: Secondary | ICD-10-CM | POA: Diagnosis not present

## 2023-07-26 DIAGNOSIS — F17200 Nicotine dependence, unspecified, uncomplicated: Secondary | ICD-10-CM | POA: Diagnosis not present

## 2023-07-26 DIAGNOSIS — E119 Type 2 diabetes mellitus without complications: Secondary | ICD-10-CM | POA: Diagnosis not present

## 2023-07-26 DIAGNOSIS — Z20822 Contact with and (suspected) exposure to covid-19: Secondary | ICD-10-CM | POA: Diagnosis not present

## 2023-07-26 DIAGNOSIS — R030 Elevated blood-pressure reading, without diagnosis of hypertension: Secondary | ICD-10-CM | POA: Diagnosis not present

## 2023-07-26 DIAGNOSIS — E6609 Other obesity due to excess calories: Secondary | ICD-10-CM | POA: Diagnosis not present

## 2023-09-23 DIAGNOSIS — L0231 Cutaneous abscess of buttock: Secondary | ICD-10-CM | POA: Diagnosis not present

## 2024-01-04 ENCOUNTER — Ambulatory Visit (INDEPENDENT_AMBULATORY_CARE_PROVIDER_SITE_OTHER): Payer: BC Managed Care – PPO | Admitting: Family Medicine

## 2024-01-04 ENCOUNTER — Encounter: Payer: Self-pay | Admitting: Family Medicine

## 2024-01-04 VITALS — BP 116/88 | HR 83 | Temp 98.4°F | Ht 72.0 in | Wt 277.0 lb

## 2024-01-04 DIAGNOSIS — E1169 Type 2 diabetes mellitus with other specified complication: Secondary | ICD-10-CM

## 2024-01-04 DIAGNOSIS — Z130 Encounter for screening for diseases of the blood and blood-forming organs and certain disorders involving the immune mechanism: Secondary | ICD-10-CM

## 2024-01-04 DIAGNOSIS — Z125 Encounter for screening for malignant neoplasm of prostate: Secondary | ICD-10-CM | POA: Diagnosis not present

## 2024-01-04 DIAGNOSIS — E669 Obesity, unspecified: Secondary | ICD-10-CM | POA: Diagnosis not present

## 2024-01-04 DIAGNOSIS — E782 Mixed hyperlipidemia: Secondary | ICD-10-CM

## 2024-01-04 DIAGNOSIS — R21 Rash and other nonspecific skin eruption: Secondary | ICD-10-CM

## 2024-01-04 DIAGNOSIS — E119 Type 2 diabetes mellitus without complications: Secondary | ICD-10-CM | POA: Insufficient documentation

## 2024-01-04 MED ORDER — KETOCONAZOLE 2 % EX CREA: 1.0000 | TOPICAL_CREAM | Freq: Every day | CUTANEOUS | 0 refills | Status: DC

## 2024-01-04 MED ORDER — FLUCONAZOLE 150 MG PO TABS: 150.0000 mg | ORAL_TABLET | ORAL | 0 refills | Status: DC

## 2024-01-04 NOTE — Patient Instructions (Signed)
 Medication as prescribed.  I will see you in a couple of weeks.  Labs ordered.

## 2024-01-05 DIAGNOSIS — R21 Rash and other nonspecific skin eruption: Secondary | ICD-10-CM | POA: Insufficient documentation

## 2024-01-05 DIAGNOSIS — E782 Mixed hyperlipidemia: Secondary | ICD-10-CM | POA: Insufficient documentation

## 2024-01-05 NOTE — Assessment & Plan Note (Signed)
 Suspected tinea cruris.  Treating with Diflucan  and ketoconazole .

## 2024-01-05 NOTE — Progress Notes (Signed)
 Subjective:  Patient ID: James Adams, male    DOB: February 06, 1976  Age: 48 y.o. MRN: [id number]  CC:   Chief Complaint  Patient presents with   Rash    X's 1 month, itchy. Tried jock itch spray to no relief.     Diarrhea    Comes and goes for extended time, concern of it side effect of Metformin      HPI:  47 year old male with obesity, type 2 diabetes, hyperlipidemia presents for evaluation of rash.  Patient reports rash in the perineal region and around the intergluteal cleft.  Has tried some over-the-counter jock itch spray without relief.  It is itchy.  Seems to be exacerbated by heat and sweat.  Patient has an upcoming physical.  Needs labs.  Patient Active Problem List   Diagnosis Date Noted   Mixed hyperlipidemia 01/05/2024   Rash 01/05/2024   Diabetes mellitus without complication (HCC)    Obesity (BMI 30-39.9) 04/01/2022    Social Hx   Social History   Socioeconomic History   Marital status: Married    Spouse name: Not on file   Number of children: Not on file   Years of education: Not on file   Highest education level: Not on file  Occupational History   Not on file  Tobacco Use   Smoking status: Every Day    Current packs/day: 0.50    Average packs/day: 0.5 packs/day for 22.0 years (11.0 ttl pk-yrs)    Types: Cigarettes   Smokeless tobacco: Never  Vaping Use   Vaping status: Former   Substances: Nicotine, Flavoring  Substance and Sexual Activity   Alcohol use: Not Currently    Comment: occ   Drug use: Never   Sexual activity: Not on file  Other Topics Concern   Not on file  Social History Narrative   Not on file   Social Drivers of Health   Financial Resource Strain: Not on file  Food Insecurity: Not on file  Transportation Needs: Not on file  Physical Activity: Not on file  Stress: Not on file  Social Connections: Not on file    Review of Systems Per HPI  Objective:  BP 116/88   Pulse 83   Temp 98.4 F (36.9 C)   Ht 6' (1.829 m)    Wt 277 lb (125.6 kg)   SpO2 100%   BMI 37.57 kg/m      01/04/2024    2:29 PM 01/28/2023    1:46 PM 01/15/2023   10:20 AM  BP/Weight  Systolic BP 116 148 131  Diastolic BP 88 88 71  Wt. (Lbs) 277 296   BMI 37.57 kg/m2 40.14 kg/m2     Physical Exam Constitutional:      Appearance: Normal appearance. He is obese.  HENT:     Head: Normocephalic and atraumatic.  Cardiovascular:     Rate and Rhythm: Normal rate and regular rhythm.  Pulmonary:     Effort: Pulmonary effort is normal.  Genitourinary:    Comments: Slight maceration noted to the upper medial thighs near the scrotum.  Similar-appearing areas at the superior aspect of the intergluteal cleft. Neurological:     Mental Status: He is alert.  Psychiatric:        Mood and Affect: Mood normal.        Behavior: Behavior normal.     Lab Results  Component Value Date   WBC 7.5 04/01/2022   HGB 15.5 04/01/2022   HCT 45.7 04/01/2022   PLT  360 04/01/2022   GLUCOSE 198 (H) 01/13/2023   CHOL 208 (H) 04/01/2022   TRIG 505 (H) 04/01/2022   HDL 31 (L) 04/01/2022   LDLCALC 93 04/01/2022   ALT 17 04/01/2022   AST 17 04/01/2022   NA 135 01/13/2023   K 3.7 01/13/2023   CL 103 01/13/2023   CREATININE 0.84 01/13/2023   BUN 12 01/13/2023   CO2 23 01/13/2023   HGBA1C 8.6 (H) 01/13/2023     Assessment & Plan:   Problem List Items Addressed This Visit       Endocrine   Diabetes mellitus without complication (HCC)   Relevant Orders   CMP14+EGFR   Microalbumin / creatinine urine ratio   Hemoglobin A1c     Musculoskeletal and Integument   Rash - Primary   Suspected tinea cruris.  Treating with Diflucan  and ketoconazole .        Other   Obesity (BMI 30-39.9)   Mixed hyperlipidemia   Relevant Orders   Lipid panel   Other Visit Diagnoses       Prostate cancer screening       Relevant Orders   PSA     Screening for deficiency anemia       Relevant Orders   CBC       Meds ordered this encounter  Medications    fluconazole  (DIFLUCAN ) 150 MG tablet    Sig: Take 1 tablet (150 mg total) by mouth once a week.    Dispense:  4 tablet    Refill:  0   ketoconazole  (NIZORAL ) 2 % cream    Sig: Apply 1 Application topically daily.    Dispense:  60 g    Refill:  0    Follow-up: Has follow-up with the end of the month for physical  Kalana Yust Bluford DO [facility]

## 2024-01-18 ENCOUNTER — Encounter: Payer: Self-pay | Admitting: Family Medicine

## 2024-01-18 ENCOUNTER — Ambulatory Visit (INDEPENDENT_AMBULATORY_CARE_PROVIDER_SITE_OTHER): Payer: BC Managed Care – PPO | Admitting: Family Medicine

## 2024-01-18 VITALS — BP 139/88 | HR 96 | Temp 98.1°F | Ht 72.0 in | Wt 282.0 lb

## 2024-01-18 DIAGNOSIS — Z23 Encounter for immunization: Secondary | ICD-10-CM | POA: Diagnosis not present

## 2024-01-18 DIAGNOSIS — M545 Low back pain, unspecified: Secondary | ICD-10-CM

## 2024-01-18 DIAGNOSIS — E782 Mixed hyperlipidemia: Secondary | ICD-10-CM

## 2024-01-18 DIAGNOSIS — Z1211 Encounter for screening for malignant neoplasm of colon: Secondary | ICD-10-CM

## 2024-01-18 DIAGNOSIS — Z0000 Encounter for general adult medical examination without abnormal findings: Secondary | ICD-10-CM | POA: Insufficient documentation

## 2024-01-18 DIAGNOSIS — E1169 Type 2 diabetes mellitus with other specified complication: Secondary | ICD-10-CM | POA: Diagnosis not present

## 2024-01-18 DIAGNOSIS — E119 Type 2 diabetes mellitus without complications: Secondary | ICD-10-CM

## 2024-01-18 DIAGNOSIS — Z7984 Long term (current) use of oral hypoglycemic drugs: Secondary | ICD-10-CM

## 2024-01-18 DIAGNOSIS — R5383 Other fatigue: Secondary | ICD-10-CM

## 2024-01-18 DIAGNOSIS — Z0001 Encounter for general adult medical examination with abnormal findings: Secondary | ICD-10-CM | POA: Diagnosis not present

## 2024-01-18 DIAGNOSIS — E669 Obesity, unspecified: Secondary | ICD-10-CM

## 2024-01-18 DIAGNOSIS — Z130 Encounter for screening for diseases of the blood and blood-forming organs and certain disorders involving the immune mechanism: Secondary | ICD-10-CM

## 2024-01-18 DIAGNOSIS — Z125 Encounter for screening for malignant neoplasm of prostate: Secondary | ICD-10-CM

## 2024-01-18 MED ORDER — KETOCONAZOLE 2 % EX CREA: 1.0000 | TOPICAL_CREAM | Freq: Every day | CUTANEOUS | 0 refills | Status: AC

## 2024-01-18 NOTE — Progress Notes (Signed)
Subjective:  Patient ID: James Adams, male    DOB: 10-Feb-1976  Age: 48 y.o. MRN: [id number]  CC:   Chief Complaint  Patient presents with   Annual Exam    HPI:  48 year old male presents for an annual exam.  Preventative Healthcare Colonoscopy: Due.  Patient amenable to referral to GI. Immunizations Tetanus -unsure of last tetanus Pneumococcal -amenable to pneumococcal vaccine today. Flu -declines. Prostate cancer screening: Planning for PSA today. Hepatitis C screening -declines. Labs: Patient is due for labs. Smoking/tobacco use: Patient is a current smoker.  He states that he has quit in the past.  Declines pharmacotherapy for cessation. STD/HIV testing: Declines.  DM Medications -currently on metformin but is experiencing diarrhea.  He is interested in GLP-1 medication. Compliance - Yes. Preventative care Eye exam - Overdue. Foot exam - Needs today. Last A1C - Needs.  Urine microalbumin - Needs.  Candidate for statin - Yes.   Patient Active Problem List   Diagnosis Date Noted   Annual physical exam 01/18/2024   Mixed hyperlipidemia 01/05/2024   Rash 01/05/2024   Diabetes mellitus without complication (HCC)    Obesity (BMI 30-39.9) 04/01/2022    Social Hx   Social History   Socioeconomic History   Marital status: Married    Spouse name: Not on file   Number of children: Not on file   Years of education: Not on file   Highest education level: Not on file  Occupational History   Not on file  Tobacco Use   Smoking status: Every Day    Current packs/day: 0.50    Average packs/day: 0.5 packs/day for 22.0 years (11.0 ttl pk-yrs)    Types: Cigarettes   Smokeless tobacco: Never  Vaping Use   Vaping status: Former   Substances: Nicotine, Flavoring  Substance and Sexual Activity   Alcohol use: Not Currently    Comment: occ   Drug use: Never   Sexual activity: Not on file  Other Topics Concern   Not on file  Social History Narrative   Not on file    Social Drivers of Health   Financial Resource Strain: Not on file  Food Insecurity: Not on file  Transportation Needs: Not on file  Physical Activity: Not on file  Stress: Not on file  Social Connections: Not on file    Review of Systems  Constitutional:  Positive for fatigue.  Musculoskeletal:  Positive for back pain.    Objective:  BP 139/88   Pulse 96   Temp 98.1 F (36.7 C)   Ht 6' (1.829 m)   Wt 282 lb (127.9 kg)   SpO2 98%   BMI 38.25 kg/m      01/18/2024    2:53 PM 01/18/2024    2:14 PM 01/04/2024    2:29 PM  BP/Weight  Systolic BP 139 168 116  Diastolic BP 88 121 88  Wt. (Lbs)  282 277  BMI  38.25 kg/m2 37.57 kg/m2    Physical Exam Vitals and nursing note reviewed.  Constitutional:      General: He is not in acute distress.    Appearance: Normal appearance.  HENT:     Head: Normocephalic and atraumatic.     Mouth/Throat:     Pharynx: Oropharynx is clear.  Eyes:     General:        Right eye: No discharge.        Left eye: No discharge.     Conjunctiva/sclera: Conjunctivae normal.  Cardiovascular:  Rate and Rhythm: Normal rate and regular rhythm.  Pulmonary:     Effort: Pulmonary effort is normal.     Breath sounds: Normal breath sounds. No wheezing, rhonchi or rales.  Abdominal:     General: There is no distension.     Palpations: Abdomen is soft.     Tenderness: There is no abdominal tenderness.  Neurological:     General: No focal deficit present.     Mental Status: He is alert.  Psychiatric:        Mood and Affect: Mood normal.        Behavior: Behavior normal.     Lab Results  Component Value Date   WBC 7.5 04/01/2022   HGB 15.5 04/01/2022   HCT 45.7 04/01/2022   PLT 360 04/01/2022   GLUCOSE 198 (H) 01/13/2023   CHOL 208 (H) 04/01/2022   TRIG 505 (H) 04/01/2022   HDL 31 (L) 04/01/2022   LDLCALC 93 04/01/2022   ALT 17 04/01/2022   AST 17 04/01/2022   NA 135 01/13/2023   K 3.7 01/13/2023   CL 103 01/13/2023    CREATININE 0.84 01/13/2023   BUN 12 01/13/2023   CO2 23 01/13/2023   HGBA1C 8.6 (H) 01/13/2023     Assessment & Plan:   Problem List Items Addressed This Visit       Endocrine   Diabetes mellitus without complication (HCC)   Relevant Orders   CMP14+EGFR   Hemoglobin A1c   Microalbumin / creatinine urine ratio     Other   Obesity (BMI 30-39.9)   Mixed hyperlipidemia   Relevant Orders   Lipid panel   Annual physical exam - Primary   Labs ordered. Patient advised to get eye exam. Foot performed today. Declines HIV and hepatitis C screening. Declines flu vaccine. Pneumococcal vaccine given. Referral to GI for colonoscopy.      Other Visit Diagnoses       Screening for deficiency anemia       Relevant Orders   CBC     Screening PSA (prostate specific antigen)       Relevant Orders   PSA     Encounter for screening colonoscopy       Relevant Orders   Ambulatory referral to Gastroenterology     Fatigue, unspecified type       Relevant Orders   Testosterone     Low back pain without sciatica, unspecified back pain laterality, unspecified chronicity       Relevant Orders   Ambulatory referral to Chiropractic     Immunization due       Relevant Orders   Pneumococcal conjugate vaccine 20-valent (Prevnar 20) (Completed)       Meds ordered this encounter  Medications   ketoconazole (NIZORAL) 2 % cream    Sig: Apply 1 Application topically daily.    Dispense:  60 g    Refill:  0    Follow-up:  3 months  Emlyn Maves Adriana Simas DO [facility]

## 2024-01-18 NOTE — Assessment & Plan Note (Signed)
Labs ordered. Patient advised to get eye exam. Foot performed today. Declines HIV and hepatitis C screening. Declines flu vaccine. Pneumococcal vaccine given. Referral to GI for colonoscopy.

## 2024-01-18 NOTE — Patient Instructions (Addendum)
Labs (8-10 am).  Referral placed for colonoscopy. Referral placed to chiropractor.  Work on smoking cessation.  We will send in medication once I see your labs.  Follow-up in 3 months.

## 2024-01-20 ENCOUNTER — Encounter (INDEPENDENT_AMBULATORY_CARE_PROVIDER_SITE_OTHER): Payer: Self-pay | Admitting: *Deleted

## 2024-01-27 DIAGNOSIS — E119 Type 2 diabetes mellitus without complications: Secondary | ICD-10-CM | POA: Diagnosis not present

## 2024-01-27 DIAGNOSIS — E782 Mixed hyperlipidemia: Secondary | ICD-10-CM | POA: Diagnosis not present

## 2024-01-27 DIAGNOSIS — Z130 Encounter for screening for diseases of the blood and blood-forming organs and certain disorders involving the immune mechanism: Secondary | ICD-10-CM | POA: Diagnosis not present

## 2024-01-27 DIAGNOSIS — Z125 Encounter for screening for malignant neoplasm of prostate: Secondary | ICD-10-CM | POA: Diagnosis not present

## 2024-01-28 LAB — PSA: Prostate Specific Ag, Serum: 2.2 ng/mL (ref 0.0–4.0)

## 2024-01-28 LAB — CBC
Hematocrit: 45.4 % (ref 37.5–51.0)
Hemoglobin: 15.1 g/dL (ref 13.0–17.7)
MCH: 30.1 pg (ref 26.6–33.0)
MCHC: 33.3 g/dL (ref 31.5–35.7)
MCV: 91 fL (ref 79–97)
Platelets: 365 10*3/uL (ref 150–450)
RBC: 5.01 x10E6/uL (ref 4.14–5.80)
RDW: 13.4 % (ref 11.6–15.4)
WBC: 8.7 10*3/uL (ref 3.4–10.8)

## 2024-01-28 LAB — HEMOGLOBIN A1C
Est. average glucose Bld gHb Est-mCnc: 292 mg/dL
Hgb A1c MFr Bld: 11.8 % — ABNORMAL HIGH (ref 4.8–5.6)

## 2024-01-28 LAB — CMP14+EGFR
ALT: 21 [IU]/L (ref 0–44)
AST: 20 [IU]/L (ref 0–40)
Albumin: 4.7 g/dL (ref 4.1–5.1)
Alkaline Phosphatase: 179 [IU]/L — ABNORMAL HIGH (ref 44–121)
BUN/Creatinine Ratio: 12 (ref 9–20)
BUN: 10 mg/dL (ref 6–24)
Bilirubin Total: 0.3 mg/dL (ref 0.0–1.2)
CO2: 22 mmol/L (ref 20–29)
Calcium: 9.7 mg/dL (ref 8.7–10.2)
Chloride: 99 mmol/L (ref 96–106)
Creatinine, Ser: 0.82 mg/dL (ref 0.76–1.27)
Globulin, Total: 2.4 g/dL (ref 1.5–4.5)
Glucose: 322 mg/dL — ABNORMAL HIGH (ref 70–99)
Potassium: 4.6 mmol/L (ref 3.5–5.2)
Sodium: 139 mmol/L (ref 134–144)
Total Protein: 7.1 g/dL (ref 6.0–8.5)
eGFR: 110 mL/min/{1.73_m2} (ref >59)

## 2024-01-28 LAB — LIPID PANEL
Chol/HDL Ratio: 5.9 {ratio} — ABNORMAL HIGH (ref 0.0–5.0)
Cholesterol, Total: 196 mg/dL (ref 100–199)
HDL: 33 mg/dL — ABNORMAL LOW (ref >39)
LDL Chol Calc (NIH): 99 mg/dL (ref 0–99)
Triglycerides: 383 mg/dL — ABNORMAL HIGH (ref 0–149)
VLDL Cholesterol Cal: 64 mg/dL — ABNORMAL HIGH (ref 5–40)

## 2024-01-28 LAB — MICROALBUMIN / CREATININE URINE RATIO
Creatinine, Urine: 101.6 mg/dL
Microalb/Creat Ratio: 25 mg/g{creat} (ref 0–29)
Microalbumin, Urine: 25.4 ug/mL

## 2024-01-30 ENCOUNTER — Encounter: Payer: Self-pay | Admitting: Family Medicine

## 2024-02-02 ENCOUNTER — Other Ambulatory Visit: Payer: Self-pay

## 2024-02-02 ENCOUNTER — Other Ambulatory Visit: Payer: Self-pay | Admitting: Family Medicine

## 2024-02-02 DIAGNOSIS — E119 Type 2 diabetes mellitus without complications: Secondary | ICD-10-CM

## 2024-02-02 MED ORDER — SEMAGLUTIDE(0.25 OR 0.5MG/DOS) 2 MG/3ML ~~LOC~~ SOPN: PEN_INJECTOR | SUBCUTANEOUS | 1 refills | Status: DC

## 2024-02-03 ENCOUNTER — Telehealth: Payer: Self-pay | Admitting: *Deleted

## 2024-02-03 DIAGNOSIS — E119 Type 2 diabetes mellitus without complications: Secondary | ICD-10-CM

## 2024-02-03 NOTE — Telephone Encounter (Signed)
Patient was identified as falling into the [place] Measure - Diabetes.   Patient was: Referred to pharmacy for chronic disease management.

## 2024-02-08 ENCOUNTER — Telehealth: Payer: Self-pay

## 2024-02-08 NOTE — Telephone Encounter (Signed)
Reason for CRM: Patient had to cancel his appointment at chiropractor because he cannot make it to [facility]. He is wanting to see if he can get a referral that is closer to his location. Please advise.   Any recommendations for different Chiropractor?

## 2024-02-18 ENCOUNTER — Telehealth: Payer: Self-pay

## 2024-02-18 NOTE — Progress Notes (Signed)
 Care Guide Pharmacy Note  02/18/2024 Name: James Adams MRN: [id number] DOB: May 08, 1976  Referred By: Tommie Sams, DO Reason for referral: Care Coordination (Outreach to schedule with Pharm d )   James Adams is a 48 y.o. year old male who is a primary care patient of Tommie Sams, DO.  James Adams was referred to the pharmacist for assistance related to: DMII  Successful contact was made with the patient to discuss pharmacy services including being ready for the pharmacist to call at least 5 minutes before the scheduled appointment time and to have medication bottles and any blood pressure readings ready for review. The patient agreed to meet with the pharmacist via telephone visit on (date/time).03/22/2024  Penne Lash , RMA     [place]  [facility] Guide  Direct Dial: [phone]  Website: [email or web address]

## 2024-03-22 ENCOUNTER — Telehealth: Payer: Self-pay | Admitting: Pharmacist

## 2024-03-22 ENCOUNTER — Other Ambulatory Visit: Payer: Self-pay | Admitting: Pharmacist

## 2024-03-22 DIAGNOSIS — E119 Type 2 diabetes mellitus without complications: Secondary | ICD-10-CM

## 2024-03-22 MED ORDER — SEMAGLUTIDE(0.25 OR 0.5MG/DOS) 2 MG/3ML ~~LOC~~ SOPN: PEN_INJECTOR | SUBCUTANEOUS | 1 refills | Status: DC

## 2024-03-22 NOTE — Telephone Encounter (Signed)
 PA completed although not required Will notify patient

## 2024-03-22 NOTE — Progress Notes (Signed)
 03/22/2024 Name: James Adams MRN: [id number] DOB: 05-12-1976  Chief Complaint  Patient presents with   Diabetes    James Adams is a 48 y.o. year old male who presented for a telephone visit.   They were referred to the pharmacist by their PCP for assistance in managing diabetes and medication access.   Subjective:  Care Team: Primary Care Provider: Cook, Jayce G, DO    Medication Access/Adherence  Current Pharmacy:  [phone] [place], [place] - [address] AT [facility] [address] [place] [address] Phone: [phone] Fax: [phone]  [facility] [address], [place] - [address] [place] [address] Phone: [phone] Fax: [phone]  [id number] - [place], [place] - [address] AT [facility] [address] [facility] [address] [place] [address] Phone: [phone] Fax: [phone]  [facility] - [place], [place] - [address] [place] [address] Phone: [phone] Fax: [phone]  [id number] [place], [place] - [address] [place] [address] Phone: [phone] Fax: [phone]  Patient reports affordability concerns with their medications: No  Patient reports access/transportation concerns to their pharmacy: No  Patient reports adherence concerns with their medications:  No     Diabetes:  Current medications: METFORMIN  Medications tried in the past:  HAS NOT GOTTEN HIS OZEMPIC --PA COMPLETED, BUT INSURANCE SAID NO PA NEEDED  Current glucose readings: N/A  Current meal patterns:  Discussed meal planning options and Plate method for healthy eating Avoid sugary drinks and desserts Incorporate balanced protein, non starchy veggies, 1 serving of carbohydrate with each meal Increase water intake Increase physical activity as able  Current physical activity: ENCOURAGED AS ABLE  Current  medication access support: BCBS   Objective:  Lab Results  Component Value Date   HGBA1C 11.8 (H) 01/27/2024    Lab Results  Component Value Date   CREATININE 0.82 01/27/2024   BUN 10 01/27/2024   NA 139 01/27/2024   K 4.6 01/27/2024   CL 99 01/27/2024   CO2 22 01/27/2024    Lab Results  Component Value Date   CHOL 196 01/27/2024   HDL 33 (L) 01/27/2024   LDLCALC 99 01/27/2024   TRIG 383 (H) 01/27/2024   CHOLHDL 5.9 (H) 01/27/2024    Medications Reviewed Today     Reviewed by Delilah Fend, [facility] (Pharmacist) on 03/22/24 at 1317  Med List Status: <None>   Medication Order Taking? Sig Documenting Provider Last Dose Status Informant  aspirin-acetaminophen -caffeine (EXCEDRIN MIGRAINE) 250-250-65 MG tablet [id number] No Take 1-2 tablets by mouth every 6 (six) hours as needed for headache. [provider] Taking Active Self  ketoconazole  (NIZORAL ) 2 % cream [id number]  Apply 1 Application topically daily. Cook, Jayce G, [place]  Active     Discontinued 06/24/20 0610   metFORMIN  (GLUCOPHAGE ) 850 MG tablet [id number] No Take 850 mg by mouth 2 (two) times daily. [provider] Taking Active Self  Patient not taking:  Discontinued 06/24/20 0610   Semaglutide ,0.25 or 0.5MG /DOS, 2 MG/3ML SOPN [id number]  0.25 mg once weekly for 4 weeks, then increase to 0.5 mg once weekly. Cook, Jayce G, DO  Active              Assessment/Plan:   Diabetes: - Currently uncontrolled--A1c 11.8% - Reviewed long term cardiovascular and renal outcomes of uncontrolled blood sugar - Reviewed goal A1c, goal fasting, and goal 2  hour post prandial glucose - Reviewed dietary modifications including FOLLOWING A HEART HEALTHY DIET/HEALTHY PLATE METHOD - Reviewed lifestyle modifications including:  increased physical activity - Recommend to :  Continue metformin  Start Ozempic --PA completed on Cover my meds (states PA was not needed/covered) [facility] in [place] states they can't get  medication Will send to [facility] per patient request - Patient denies personal or family history of multiple endocrine neoplasia type 2, medullary thyroid cancer; personal history of pancreatitis or gallbladder disease. - Recommend to check glucose daily (fasting) or if symptomatic    Follow Up Plan: 1 month  Marvell Slider, PharmD, BCACP, CPP Clinical Pharmacist, [facility]

## 2024-03-23 MED ORDER — SEMAGLUTIDE(0.25 OR 0.5MG/DOS) 2 MG/3ML ~~LOC~~ SOPN: PEN_INJECTOR | SUBCUTANEOUS | 1 refills | Status: AC

## 2024-05-04 ENCOUNTER — Telehealth: Payer: Self-pay | Admitting: *Deleted

## 2024-05-04 ENCOUNTER — Other Ambulatory Visit: Payer: Self-pay | Admitting: Family Medicine

## 2024-05-04 ENCOUNTER — Ambulatory Visit: Payer: Self-pay

## 2024-05-04 NOTE — Telephone Encounter (Signed)
 Received call from patient [phone]~ telephone.   Patient reports that he has new abscess like area in his perianal region. Requested appointment with provider and prescription for ABTx.   Advised that patient has not been seen in office >1 year. Advised that no Rx can be given this far out. Advised to follow up with PCP/ UC at this time.   Advised that next available appointment is 05/16/2024. Patient agreeable to appointment.

## 2024-05-04 NOTE — Telephone Encounter (Signed)
  Chief Complaint: Cyst Symptoms: perianal cyst-size of a marble Frequency: three days ago Pertinent Negatives: Patient denies fever Disposition: [] ED /[] Urgent Care (no appt availability in office) / [] Appointment(In office/virtual)/ []  [facility]/ [] Home Care/ [] Refused Recommended Disposition /[] [facility]/ [x]  Follow-up with PCP Additional Notes: patient with hx of perianal cyst that required surgery-calling today with concerns for another perianal cyst. Patient states three days ago began having discomfort in the same region and found an area of swelling the size of a large marble. Patient endorses pain level of 5 out of 10 currently. Patient called the surgery clinic and was told he couldn't see the surgeon until end of May and was told to follow up with PCP. No availability until Monday per decision tree. Patient is recommended to be seen within 24 hours. Patient refusing UC or ED at this time due to having been told in the past that neither UC or ED could help him and he needed to see a surgeon.  Please follow up with patient    Copied from CRM [id number]. Topic: Clinical - Red Word Triage >> May 04, 2024  1:47 PM Fredrica W wrote: Red Word that prompted transfer to Nurse Triage: Painful perianal cyst - previously had surgery but now has another one and appt not until next week. Reason for Disposition  [1] Swelling is painful to touch AND [2] no fever  Answer Assessment - Initial Assessment Questions 1. APPEARANCE of SWELLING: "What does it look like?"     Patient unable to say what it looks like 2. SIZE: "How large is the swelling?" (e.g., inches, cm; or compare to size of pinhead, tip of pen, eraser, coin, pea, grape, ping pong ball)      Size of large marble 3. LOCATION: "Where is the swelling located?"     Perianal cyst 4. ONSET: "When did the swelling start?"     Three days ago 5. COLOR: "What color is it?" "Is there more than one color?"     unsure 6.  PAIN: "Is there any pain?" If Yes, ask: "How bad is the pain?" (e.g., scale 1-10; or mild, moderate, severe)     - NONE (0): no pain   - MILD (1-3): doesn't interfere with normal activities    - MODERATE (4-7): interferes with normal activities or awakens from sleep    - SEVERE (8-10): excruciating pain, unable to do any normal activities     5 out of 10 7. ITCH: "Does it itch?" If Yes, ask: "How bad is the itch?"      yes 8. CAUSE: "What do you think caused the swelling?"  cyst 9 OTHER SYMPTOMS: "Do you have any other symptoms?" (e.g., fever)     no  Protocols used: Skin Lump or Localized Swelling-A-AH

## 2024-05-05 NOTE — Telephone Encounter (Signed)
 Received call from patient PCP office. Requested for patient to be worked in urgently.   Advised that as outpatient surgery, providers are only in the office on Tuesday/ Thursday during the week. Advised that remaining time is spent at the hospital in consults, operating, or rounding on admitted patients. Advised that if PCP cannot assist patient at this time, patient will need to go to ER for evaluation. Advised that on call surgeon is available at the hospital if needed.

## 2024-05-05 NOTE — Telephone Encounter (Signed)
 Cook, Jayce G, DO     Herndon Grill,  Can you call [facility] surgical and see if they can see him ASAP please?  JC DO

## 2024-05-05 NOTE — Telephone Encounter (Signed)
 Spoke with [facility] and they stated they did not have ability to work patient in but he could go to ER and the surgeon on call could help him- Patient advised and verbalized understanding.  Patient stated he will go to ER this weekend and would like antibiotic called I to help till he can get to ER

## 2024-05-07 DIAGNOSIS — K611 Rectal abscess: Secondary | ICD-10-CM | POA: Diagnosis not present

## 2024-05-09 NOTE — Telephone Encounter (Signed)
 Cook, Jayce G, DO     Went to an Urgent Care.

## 2024-05-10 DIAGNOSIS — L02215 Cutaneous abscess of perineum: Secondary | ICD-10-CM | POA: Diagnosis not present

## 2024-05-16 ENCOUNTER — Ambulatory Visit: Admitting: General Surgery

## 2024-05-25 DIAGNOSIS — K611 Rectal abscess: Secondary | ICD-10-CM | POA: Diagnosis not present

## 2024-05-25 DIAGNOSIS — K612 Anorectal abscess: Secondary | ICD-10-CM | POA: Diagnosis not present

## 2024-05-25 DIAGNOSIS — L02215 Cutaneous abscess of perineum: Secondary | ICD-10-CM | POA: Diagnosis not present

## 2024-05-25 DIAGNOSIS — E119 Type 2 diabetes mellitus without complications: Secondary | ICD-10-CM | POA: Diagnosis not present

## 2024-06-07 DIAGNOSIS — Z09 Encounter for follow-up examination after completed treatment for conditions other than malignant neoplasm: Secondary | ICD-10-CM | POA: Diagnosis not present

## 2024-06-28 DIAGNOSIS — K603 Anal fistula, unspecified: Secondary | ICD-10-CM | POA: Diagnosis not present

## 2024-07-07 DIAGNOSIS — K047 Periapical abscess without sinus: Secondary | ICD-10-CM | POA: Diagnosis not present

## 2024-07-07 DIAGNOSIS — L02215 Cutaneous abscess of perineum: Secondary | ICD-10-CM | POA: Diagnosis not present

## 2024-07-07 DIAGNOSIS — K603 Anal fistula, unspecified: Secondary | ICD-10-CM | POA: Diagnosis not present

## 2024-07-19 ENCOUNTER — Encounter (INDEPENDENT_AMBULATORY_CARE_PROVIDER_SITE_OTHER): Payer: Self-pay | Admitting: *Deleted

## 2024-09-20 ENCOUNTER — Ambulatory Visit: Payer: Self-pay | Admitting: Family Medicine

## 2024-10-31 ENCOUNTER — Telehealth: Payer: Self-pay | Admitting: Pharmacy Technician

## 2024-10-31 ENCOUNTER — Other Ambulatory Visit (HOSPITAL_COMMUNITY): Payer: Self-pay

## 2024-10-31 NOTE — Telephone Encounter (Signed)
 Pharmacy Patient Advocate Encounter   Received notification from CoverMyMeds that prior authorization for Ozempic  (0.25 or 0.5 MG/DOSE) 2MG /3ML pen-injectors is required/requested.   Insurance verification completed.   The patient is insured through [employer].   Per test claim: PA required; PA submitted to above mentioned insurance via Latent Key/confirmation #/EOC [id number] Status is pending

## 2024-10-31 NOTE — Telephone Encounter (Signed)
 Pharmacy Patient Advocate Encounter  Received notification from [facility] [employer] that Prior Authorization for Ozempic  (0.25 or 0.5 MG/DOSE) 2MG /3ML pen-injectors has been closed due to.     If patient does not have other primary insurance he will need to contact the number above to have his coordination of benefits corrected.   PA #/Case ID/Reference #: [id number]

## 2024-11-01 ENCOUNTER — Other Ambulatory Visit (HOSPITAL_COMMUNITY): Payer: Self-pay

## 2024-11-15 ENCOUNTER — Other Ambulatory Visit (HOSPITAL_COMMUNITY): Payer: Self-pay

## 2024-11-15 ENCOUNTER — Telehealth: Payer: Self-pay | Admitting: Pharmacy Technician

## 2024-11-15 NOTE — Telephone Encounter (Signed)
 Pharmacy Patient Advocate Encounter   Received notification from CoverMyMeds that prior authorization for Ozempic  (0.25 or 0.5 MG/DOSE) 2MG /3ML pen-injectors is required/requested.   Insurance verification completed.   The patient is insured through [facility].   Per test claim: PA required; PA submitted to above mentioned insurance via Latent Key/confirmation #/EOC [id number] Status is pending

## 2024-11-24 ENCOUNTER — Other Ambulatory Visit (HOSPITAL_COMMUNITY): Payer: Self-pay

## 2024-11-24 NOTE — Telephone Encounter (Signed)
 Pharmacy Patient Advocate Encounter  Received notification from [facility] that Prior Authorization for Ozempic  (0.25 or 0.5 MG/DOSE) 2MG /3ML pen-injectors has been APPROVED from 11/15/2024 to 11/14/2025. Ran test claim, Copay is $24.99. This test claim was processed through [facility]- copay amounts may vary at other pharmacies due to pharmacy/plan contracts, or as the patient moves through the different stages of their insurance plan.   PA #/Case ID/Reference #: [id number]

## 2025-01-04 ENCOUNTER — Telehealth: Payer: Self-pay | Admitting: Pharmacy Technician

## 2025-01-04 ENCOUNTER — Other Ambulatory Visit (HOSPITAL_COMMUNITY): Payer: Self-pay

## 2025-01-04 NOTE — Telephone Encounter (Signed)
 Pharmacy Patient Advocate Encounter   Received notification from [employer] that prior authorization for Ozempic  0.5mg   is required/requested.   Insurance verification completed.   The patient is insured through [employer] ([employer]).   Per test claim: Must submit to primary payer. If patient does not have other primary insurance he will need to contact his Medicaid Plan to have his coordination of benefits corrected.  Previous primary payer has ended. Eligibility check doesn't pull anything new.  Archived Key: [employer]

## 2025-01-23 ENCOUNTER — Other Ambulatory Visit (HOSPITAL_COMMUNITY): Payer: Self-pay
